# Patient Record
Sex: Female | Born: 1970 | Race: Black or African American | Hispanic: No | Marital: Single | Smoking: Former smoker
Health system: Southern US, Community
[De-identification: ages and names within clinical notes are randomized; demographics above are authoritative.]

## PROBLEM LIST (undated history)

## (undated) DIAGNOSIS — T7840XA Allergy, unspecified, initial encounter: Secondary | ICD-10-CM

## (undated) DIAGNOSIS — G43109 Migraine with aura, not intractable, without status migrainosus: Secondary | ICD-10-CM

## (undated) DIAGNOSIS — E119 Type 2 diabetes mellitus without complications: Secondary | ICD-10-CM

## (undated) HISTORY — PX: TUBAL LIGATION: SHX77

## (undated) HISTORY — PX: KNEE SURGERY: SHX244

## (undated) HISTORY — DX: Allergy, unspecified, initial encounter: T78.40XA

## (undated) HISTORY — PX: COLONOSCOPY: SHX174

## (undated) HISTORY — PX: BREAST SURGERY: SHX581

---

## 1999-05-23 ENCOUNTER — Other Ambulatory Visit: Admission: RE | Admit: 1999-05-23 | Discharge: 1999-05-23 | Payer: Self-pay | Admitting: Obstetrics

## 1999-11-03 ENCOUNTER — Inpatient Hospital Stay (HOSPITAL_COMMUNITY): Admission: AD | Admit: 1999-11-03 | Discharge: 1999-11-03 | Payer: Self-pay | Admitting: Obstetrics

## 1999-11-11 ENCOUNTER — Inpatient Hospital Stay (HOSPITAL_COMMUNITY): Admission: AD | Admit: 1999-11-11 | Discharge: 1999-11-11 | Payer: Self-pay | Admitting: Obstetrics

## 1999-11-13 ENCOUNTER — Inpatient Hospital Stay (HOSPITAL_COMMUNITY): Admission: AD | Admit: 1999-11-13 | Discharge: 1999-11-13 | Payer: Self-pay | Admitting: Obstetrics

## 1999-11-15 ENCOUNTER — Inpatient Hospital Stay (HOSPITAL_COMMUNITY): Admission: AD | Admit: 1999-11-15 | Discharge: 1999-11-15 | Payer: Self-pay | Admitting: Obstetrics

## 1999-11-15 ENCOUNTER — Inpatient Hospital Stay (HOSPITAL_COMMUNITY): Admission: AD | Admit: 1999-11-15 | Discharge: 1999-11-17 | Payer: Self-pay | Admitting: Obstetrics

## 1999-12-27 ENCOUNTER — Ambulatory Visit (HOSPITAL_COMMUNITY): Admission: RE | Admit: 1999-12-27 | Discharge: 1999-12-27 | Payer: Self-pay | Admitting: Obstetrics

## 2000-06-30 ENCOUNTER — Other Ambulatory Visit: Admission: RE | Admit: 2000-06-30 | Discharge: 2000-06-30 | Payer: Self-pay | Admitting: Plastic Surgery

## 2002-03-10 ENCOUNTER — Emergency Department (HOSPITAL_COMMUNITY): Admission: EM | Admit: 2002-03-10 | Discharge: 2002-03-10 | Payer: Self-pay | Admitting: Emergency Medicine

## 2002-03-10 ENCOUNTER — Encounter: Payer: Self-pay | Admitting: Emergency Medicine

## 2005-11-27 ENCOUNTER — Emergency Department (HOSPITAL_COMMUNITY): Admission: EM | Admit: 2005-11-27 | Discharge: 2005-11-27 | Payer: Self-pay | Admitting: Emergency Medicine

## 2006-07-18 ENCOUNTER — Emergency Department (HOSPITAL_COMMUNITY): Admission: EM | Admit: 2006-07-18 | Discharge: 2006-07-18 | Payer: Self-pay | Admitting: Family Medicine

## 2007-03-17 ENCOUNTER — Ambulatory Visit: Payer: Self-pay | Admitting: Gastroenterology

## 2007-03-17 LAB — CONVERTED CEMR LAB
ALT: 22 units/L (ref 0–40)
AST: 20 units/L (ref 0–37)
Albumin: 3.7 g/dL (ref 3.5–5.2)
Alkaline Phosphatase: 45 units/L (ref 39–117)
BUN: 11 mg/dL (ref 6–23)
Basophils Absolute: 0 10*3/uL (ref 0.0–0.1)
Basophils Relative: 0.2 % (ref 0.0–1.0)
Bilirubin, Direct: 0.1 mg/dL (ref 0.0–0.3)
CO2: 29 meq/L (ref 19–32)
Calcium: 9 mg/dL (ref 8.4–10.5)
Chloride: 108 meq/L (ref 96–112)
Creatinine, Ser: 0.7 mg/dL (ref 0.4–1.2)
Eosinophils Absolute: 0.5 10*3/uL (ref 0.0–0.6)
Eosinophils Relative: 5.5 % — ABNORMAL HIGH (ref 0.0–5.0)
GFR calc Af Amer: 123 mL/min
GFR calc non Af Amer: 102 mL/min
Glucose, Bld: 101 mg/dL — ABNORMAL HIGH (ref 70–99)
HCT: 38.7 % (ref 36.0–46.0)
Hemoglobin: 13.5 g/dL (ref 12.0–15.0)
Lymphocytes Relative: 41.5 % (ref 12.0–46.0)
MCHC: 34.9 g/dL (ref 30.0–36.0)
MCV: 85.9 fL (ref 78.0–100.0)
Monocytes Absolute: 0.5 10*3/uL (ref 0.2–0.7)
Monocytes Relative: 5.2 % (ref 3.0–11.0)
Neutro Abs: 4.4 10*3/uL (ref 1.4–7.7)
Neutrophils Relative %: 47.6 % (ref 43.0–77.0)
Platelets: 302 10*3/uL (ref 150–400)
Potassium: 4.1 meq/L (ref 3.5–5.1)
RBC: 4.51 M/uL (ref 3.87–5.11)
RDW: 12 % (ref 11.5–14.6)
Sodium: 142 meq/L (ref 135–145)
TSH: 0.52 microintl units/mL (ref 0.35–5.50)
Total Bilirubin: 0.9 mg/dL (ref 0.3–1.2)
Total Protein: 7 g/dL (ref 6.0–8.3)
WBC: 9.2 10*3/uL (ref 4.5–10.5)

## 2007-04-06 ENCOUNTER — Ambulatory Visit: Payer: Self-pay | Admitting: Gastroenterology

## 2007-05-12 ENCOUNTER — Ambulatory Visit: Payer: Self-pay | Admitting: Gastroenterology

## 2007-05-19 ENCOUNTER — Encounter: Payer: Self-pay | Admitting: Gastroenterology

## 2007-05-19 ENCOUNTER — Ambulatory Visit: Payer: Self-pay | Admitting: Gastroenterology

## 2008-01-12 ENCOUNTER — Ambulatory Visit (HOSPITAL_COMMUNITY): Admission: RE | Admit: 2008-01-12 | Discharge: 2008-01-12 | Payer: Self-pay | Admitting: Obstetrics

## 2008-01-22 ENCOUNTER — Encounter: Admission: RE | Admit: 2008-01-22 | Discharge: 2008-01-22 | Payer: Self-pay | Admitting: Obstetrics

## 2008-04-06 DIAGNOSIS — J309 Allergic rhinitis, unspecified: Secondary | ICD-10-CM | POA: Insufficient documentation

## 2008-05-31 ENCOUNTER — Ambulatory Visit: Payer: Self-pay | Admitting: Vascular Surgery

## 2008-05-31 ENCOUNTER — Encounter (INDEPENDENT_AMBULATORY_CARE_PROVIDER_SITE_OTHER): Payer: Self-pay | Admitting: Emergency Medicine

## 2008-05-31 ENCOUNTER — Emergency Department (HOSPITAL_COMMUNITY): Admission: EM | Admit: 2008-05-31 | Discharge: 2008-05-31 | Payer: Self-pay | Admitting: Emergency Medicine

## 2010-07-24 ENCOUNTER — Emergency Department (HOSPITAL_COMMUNITY): Admission: EM | Admit: 2010-07-24 | Discharge: 2010-07-24 | Payer: Self-pay | Admitting: Emergency Medicine

## 2011-03-05 NOTE — Assessment & Plan Note (Signed)
[facility]                         GASTROENTEROLOGY OFFICE NOTE   NAME:Adriana Preston, Adriana Preston                     MRN:          [id number]  DATE:03/17/2007                            DOB:          11-15-1970    GASTROENTEROLOGY CONSULTATION   REFERRING PHYSICIAN:  Patient was referred by Dr. Francoise Ceo.   PRIMARY CARE PHYSICIAN:  The patient has no primary care physician.   REASON FOR CONSULTATION:  Dr. Gaynell Face asked me to evaluate [name]  in consultation regarding intermittent rectal bleeding.   HISTORY OF PRESENT ILLNESS:  [name] is a very pleasant 40 year old  woman who for many years has had frequent stools on a daily basis.  She  goes generally four to five times a day.  They are formed and rarely  ever loose.  She will awaken at night just to move her bowels on a rare  occasion.  In the past two months she has seen blood in her stool on two  occasions.  She has mild IBS-like cramping prior to many bowel movements  that resolves after she moves her bowels.  She has no other abdominal  pains.  She had recent laboratory tests done by Dr. Gaynell Face showing a  hemoglobin of 11.3.   REVIEW OF SYSTEMS:  Her review of systems is notable for a 30 pound  weight gain in the past year and 100 pound weight gain in the past three  years.  The rest of her review of systems is essentially normal and is  available on her nursing intake sheet.   PAST MEDICAL HISTORY:  Status post tubal ligation in 2001.  Status post  breast surgery 2001.   CURRENT MEDICATIONS:  None.   ALLERGIES:  PENICILLIN.   SOCIAL HISTORY:  She is single with three children. 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  She smokes two to three cigarettes a  day.  Drinks alcohol intermittently.  Has two caffeinated beverages  daily.   FAMILY HISTORY:  No colon cancer, colon polyps in the family.   PHYSICAL EXAMINATION:  VITAL SIGNS:  Patient is 5 feet, 7  inches, 234  pounds (calculated BMI of 37).  Blood pressure 118/86. Pulse 84.  CONSTITUTIONAL:  Obese but otherwise well-appearing.  NEUROLOGICAL:  Alert and oriented x3.  HEENT:  Eyes: Extraocular movements intact. Mouth with oropharynx moist  with no lesions.  NECK:  Supple. No  lymphadenopathy.  CARDIOVASCULAR:  Heart has regular rate and rhythm.  LUNGS:  Clear to auscultation bilaterally.  ABDOMEN:  Soft, nontender, nondistended.  Normal bowel sounds.  EXTREMITIES:  No lower extremity edema.  SKIN:  No rashes or lesions on visible extremities.   ASSESSMENT/PLAN:  The patient is a 40 year old woman with chronic bowel  frequency, rare nocturnal stools, two episodes of minor rectal bleeding.   She may simply have hemorrhoids or possibly some minor  colitis/proctitis.  She does have more than the usual number of stools a  day and some intermittent nocturnal stooling.  I think it is unlikely  that she has colon cancer.  We should definitely proceed with  full  colonoscopy at her soonest convenience.  I will also get a set of lab  tests including a complete metabolic profile, thyroid testing and CBC.  She has been gaining weight lately at a pretty fast clip and unless this  is thyroid-related, she is going to have to seriously change her diet.     Rachael Fee, MD  Electronically Signed    DPJ/MedQ  DD: 03/17/2007  DT: 03/17/2007  Job #: [id number]   cc:   Kathreen Cosier, M.D.

## 2011-10-13 ENCOUNTER — Observation Stay (HOSPITAL_COMMUNITY)
Admission: EM | Admit: 2011-10-13 | Discharge: 2011-10-17 | Disposition: A | Discharge status: Home / Self Care | Payer: Medicaid Other | Attending: Internal Medicine | Admitting: Internal Medicine

## 2011-10-13 DIAGNOSIS — L03119 Cellulitis of unspecified part of limb: Secondary | ICD-10-CM

## 2011-10-13 DIAGNOSIS — L02419 Cutaneous abscess of limb, unspecified: Principal | ICD-10-CM | POA: Insufficient documentation

## 2011-10-13 DIAGNOSIS — R739 Hyperglycemia, unspecified: Secondary | ICD-10-CM

## 2011-10-13 DIAGNOSIS — E119 Type 2 diabetes mellitus without complications: Secondary | ICD-10-CM | POA: Insufficient documentation

## 2011-10-13 DIAGNOSIS — R519 Headache, unspecified: Secondary | ICD-10-CM

## 2011-10-13 DIAGNOSIS — J309 Allergic rhinitis, unspecified: Secondary | ICD-10-CM

## 2011-10-13 DIAGNOSIS — R51 Headache: Secondary | ICD-10-CM | POA: Insufficient documentation

## 2011-10-13 HISTORY — DX: Migraine with aura, not intractable, without status migrainosus: G43.109

## 2011-10-14 ENCOUNTER — Inpatient Hospital Stay (HOSPITAL_COMMUNITY): Payer: Medicaid Other

## 2011-10-14 ENCOUNTER — Other Ambulatory Visit: Payer: Self-pay

## 2011-10-14 DIAGNOSIS — R519 Headache, unspecified: Secondary | ICD-10-CM

## 2011-10-14 DIAGNOSIS — M79609 Pain in unspecified limb: Secondary | ICD-10-CM

## 2011-10-14 DIAGNOSIS — R739 Hyperglycemia, unspecified: Secondary | ICD-10-CM

## 2011-10-14 DIAGNOSIS — L539 Erythematous condition, unspecified: Secondary | ICD-10-CM

## 2011-10-14 DIAGNOSIS — R51 Headache: Secondary | ICD-10-CM

## 2011-10-14 DIAGNOSIS — L03119 Cellulitis of unspecified part of limb: Secondary | ICD-10-CM

## 2011-10-14 LAB — DIFFERENTIAL
Basophils Absolute: 0 10*3/uL (ref 0.0–0.1)
Lymphocytes Relative: 29 % (ref 12–46)
Monocytes Absolute: 0.8 10*3/uL (ref 0.1–1.0)
Neutro Abs: 8.1 10*3/uL — ABNORMAL HIGH (ref 1.7–7.7)

## 2011-10-14 LAB — COMPREHENSIVE METABOLIC PANEL
ALT: 30 U/L (ref 0–35)
AST: 18 U/L (ref 0–37)
Albumin: 3.3 g/dL — ABNORMAL LOW (ref 3.5–5.2)
Alkaline Phosphatase: 80 U/L (ref 39–117)
Glucose, Bld: 335 mg/dL — ABNORMAL HIGH (ref 70–99)
Potassium: 3.5 mEq/L (ref 3.5–5.1)
Sodium: 134 mEq/L — ABNORMAL LOW (ref 135–145)
Total Protein: 7.5 g/dL (ref 6.0–8.3)

## 2011-10-14 LAB — D-DIMER, QUANTITATIVE: D-Dimer, Quant: 0.4 ug/mL-FEU (ref 0.00–0.48)

## 2011-10-14 LAB — BASIC METABOLIC PANEL
CO2: 26 mEq/L (ref 19–32)
Chloride: 95 mEq/L — ABNORMAL LOW (ref 96–112)
Creatinine, Ser: 0.77 mg/dL (ref 0.50–1.10)

## 2011-10-14 LAB — GLUCOSE, CAPILLARY
Glucose-Capillary: 340 mg/dL — ABNORMAL HIGH (ref 70–99)
Glucose-Capillary: 312 mg/dL — ABNORMAL HIGH (ref 70–99)

## 2011-10-14 LAB — CBC
HCT: 40.2 % (ref 36.0–46.0)
HCT: 39 % (ref 36.0–46.0)
Hemoglobin: 13.6 g/dL (ref 12.0–15.0)
MCH: 28.7 pg (ref 26.0–34.0)
MCHC: 33.3 g/dL (ref 30.0–36.0)
MCV: 86.1 fL (ref 78.0–100.0)
RDW: 12.3 % (ref 11.5–15.5)
RDW: 12.4 % (ref 11.5–15.5)
WBC: 12.8 10*3/uL — ABNORMAL HIGH (ref 4.0–10.5)

## 2011-10-14 LAB — SEDIMENTATION RATE: Sed Rate: 20 mm/hr (ref 0–22)

## 2011-10-14 MED ORDER — DIPHENHYDRAMINE HCL 50 MG/ML IJ SOLN
25.0000 mg | Freq: Once | INTRAMUSCULAR | Status: AC
  Administered 2011-10-14: 25 mg via INTRAVENOUS
  Filled 2011-10-14: qty 1

## 2011-10-14 MED ORDER — METOCLOPRAMIDE HCL 5 MG/ML IJ SOLN
10.0000 mg | Freq: Once | INTRAMUSCULAR | Status: AC
  Administered 2011-10-14: 10 mg via INTRAVENOUS
  Filled 2011-10-14: qty 2

## 2011-10-14 MED ORDER — PIPERACILLIN-TAZOBACTAM 3.375 G IVPB
3.3750 g | Freq: Three times a day (TID) | INTRAVENOUS | Status: DC
  Administered 2011-10-14 – 2011-10-17 (×9): 3.375 g via INTRAVENOUS
  Filled 2011-10-14 (×11): qty 50

## 2011-10-14 MED ORDER — ACETAMINOPHEN 325 MG PO TABS
650.0000 mg | ORAL_TABLET | Freq: Four times a day (QID) | ORAL | Status: DC | PRN
  Administered 2011-10-14 – 2011-10-15 (×4): 650 mg via ORAL
  Filled 2011-10-14 (×4): qty 2

## 2011-10-14 MED ORDER — ONDANSETRON HCL 4 MG/2ML IJ SOLN: 4.0000 mg | Freq: Three times a day (TID) | INTRAMUSCULAR | Status: DC | PRN

## 2011-10-14 MED ORDER — OXYCODONE HCL 5 MG PO TABS
5.0000 mg | ORAL_TABLET | ORAL | Status: DC | PRN
  Administered 2011-10-14 – 2011-10-17 (×13): 5 mg via ORAL
  Filled 2011-10-14 (×13): qty 1

## 2011-10-14 MED ORDER — ONDANSETRON HCL 4 MG PO TABS: 4.0000 mg | ORAL_TABLET | Freq: Four times a day (QID) | ORAL | Status: DC | PRN

## 2011-10-14 MED ORDER — INSULIN ASPART 100 UNIT/ML ~~LOC~~ SOLN
10.0000 [IU] | Freq: Once | SUBCUTANEOUS | Status: AC
  Administered 2011-10-14: 10 [IU] via INTRAVENOUS
  Filled 2011-10-14: qty 1

## 2011-10-14 MED ORDER — MORPHINE SULFATE 4 MG/ML IJ SOLN
4.0000 mg | Freq: Once | INTRAMUSCULAR | Status: AC
  Administered 2011-10-14: 4 mg via INTRAVENOUS
  Filled 2011-10-14: qty 1

## 2011-10-14 MED ORDER — CIPROFLOXACIN IN D5W 400 MG/200ML IV SOLN
400.0000 mg | Freq: Two times a day (BID) | INTRAVENOUS | Status: DC
  Administered 2011-10-14 (×2): 400 mg via INTRAVENOUS
  Filled 2011-10-14 (×3): qty 200

## 2011-10-14 MED ORDER — INSULIN ASPART 100 UNIT/ML ~~LOC~~ SOLN
0.0000 [IU] | Freq: Three times a day (TID) | SUBCUTANEOUS | Status: DC
  Administered 2011-10-14: 7 [IU] via SUBCUTANEOUS
  Administered 2011-10-14: 5 [IU] via SUBCUTANEOUS
  Administered 2011-10-14: 7 [IU] via SUBCUTANEOUS
  Administered 2011-10-15: 5 [IU] via SUBCUTANEOUS
  Filled 2011-10-14: qty 3

## 2011-10-14 MED ORDER — INSULIN GLARGINE 100 UNIT/ML ~~LOC~~ SOLN
10.0000 [IU] | Freq: Every day | SUBCUTANEOUS | Status: DC
  Administered 2011-10-14: 10 [IU] via SUBCUTANEOUS
  Filled 2011-10-14: qty 3

## 2011-10-14 MED ORDER — SODIUM CHLORIDE 0.9 % IV SOLN: INTRAVENOUS | Status: DC

## 2011-10-14 MED ORDER — ACETAMINOPHEN 650 MG RE SUPP: 650.0000 mg | Freq: Four times a day (QID) | RECTAL | Status: DC | PRN

## 2011-10-14 MED ORDER — ONDANSETRON HCL 4 MG/2ML IJ SOLN: 4.0000 mg | Freq: Four times a day (QID) | INTRAMUSCULAR | Status: DC | PRN

## 2011-10-14 MED ORDER — CEFAZOLIN SODIUM 1-5 GM-% IV SOLN
1.0000 g | Freq: Once | INTRAVENOUS | Status: AC
  Administered 2011-10-14: 1 g via INTRAVENOUS
  Filled 2011-10-14: qty 50

## 2011-10-14 MED ORDER — HYDROMORPHONE HCL PF 1 MG/ML IJ SOLN: 1.0000 mg | INTRAMUSCULAR | Status: DC | PRN

## 2011-10-14 MED ORDER — ENOXAPARIN SODIUM 40 MG/0.4ML ~~LOC~~ SOLN
40.0000 mg | SUBCUTANEOUS | Status: DC
  Administered 2011-10-14 – 2011-10-16 (×3): 40 mg via SUBCUTANEOUS
  Filled 2011-10-14 (×4): qty 0.4

## 2011-10-14 MED ORDER — VANCOMYCIN HCL 1000 MG IV SOLR
1500.0000 mg | INTRAVENOUS | Status: DC
  Administered 2011-10-14 – 2011-10-17 (×4): 1500 mg via INTRAVENOUS
  Filled 2011-10-14 (×5): qty 1500

## 2011-10-14 MED ORDER — SODIUM CHLORIDE 0.9 % IV SOLN
INTRAVENOUS | Status: DC
  Administered 2011-10-14 – 2011-10-15 (×2): via INTRAVENOUS

## 2011-10-14 MED ORDER — SODIUM CHLORIDE 0.9 % IV SOLN
INTRAVENOUS | Status: DC
  Administered 2011-10-14: 125 mL via INTRAVENOUS

## 2011-10-14 NOTE — Progress Notes (Signed)
Subjective: No Ha; no CP, no SOB. RLE with tenderness to palpation; also with erythema and warm feeling. LE duplex negative.  Objective: Vital signs in last 24 hours: Temp:  [97 F (36.1 C)-99.1 F (37.3 C)] 97 F (36.1 C) (12/24 1328) Pulse Rate:  [85-106] 92  (12/24 1328) Resp:  [14-20] 18  (12/24 1328) BP: (115-138)/(61-84) 128/84 mmHg (12/24 1328) SpO2:  [82 %-100 %] 97 % (12/24 1328) Weight:  [53.343 kg (117 lb 9.6 oz)] 117 lb 9.6 oz (53.343 kg) (12/24 0415) Weight change:  Last BM Date: 10/13/11  Intake/Output from previous day: 12/23 0701 - 12/24 0700 In: 240 [P.O.:240] Out: -  Total I/O In: 740 [P.O.:240; IV Piggyback:500] Out: -    Physical Exam: General: Alert, awake, oriented x3, in no acute distress. HEENT: No bruits, no goiter. Heart: Regular rate and rhythm, without murmurs, rubs, gallops. Lungs: Clear to auscultation bilaterally. Abdomen: Soft, nontender, nondistended, positive bowel sounds. Extremities: warm to touch, tender and with erythema on her RLE; no edema. Neuro: Grossly intact, nonfocal.  Lab Results: Basic Metabolic Panel:  Basename 10/14/11 0852 10/14/11 0107  NA 134* 130*  K 3.5 4.0  CL 100 95*  CO2 26 26  GLUCOSE 335* 438*  BUN 10 13  CREATININE 0.58 0.77  CALCIUM 9.0 9.6  MG -- --  PHOS -- --   Liver Function Tests:  [facility] 10/14/11 0852  AST 18  ALT 30  ALKPHOS 80  BILITOT 0.5  PROT 7.5  ALBUMIN 3.3*   CBC:  Basename 10/14/11 0852 10/14/11 0107  WBC 12.0* 12.8*  NEUTROABS -- 8.1*  HGB 13.0 13.6  HCT 39.0 40.2  MCV 86.1 85.9  PLT 256 274   D-Dimer:  Basename 10/14/11 0107  DDIMER 0.40   CBG:  Basename 10/14/11 1721 10/14/11 1106 10/14/11 0412  GLUCAP 312* 280* 340*   Hemoglobin A1C:  Basename 10/14/11 0852  HGBA1C 12.4*   Thyroid Function Tests:  Basename 10/14/11 0852  TSH 2.118  T4TOTAL --  FREET4 --  T3FREE --  THYROIDAB --    Studies/Results: Ct Head Wo Contrast  10/14/2011   *RADIOLOGY REPORT*  Clinical Data: 40 year old female with headache.  Cellulitis.  CT HEAD WITHOUT CONTRAST  Technique:  Contiguous axial images were obtained from the base of the skull through the vertex without contrast.  Comparison: None.  Findings: Visualized paranasal sinuses and mastoids are clear.  No acute osseous abnormality identified.  Visualized orbits and scalp soft tissues are within normal limits.  Cerebral volume is within normal limits for age.  No midline shift, ventriculomegaly, mass effect, evidence of mass lesion, intracranial hemorrhage or evidence of cortically based acute infarction.  Gray-white matter differentiation is within normal limits throughout the brain.  No suspicious intracranial vascular hyperdensity.   IMPRESSION: Normal noncontrast CT appearance of the brain.  Original Report Authenticated By: Harley Hallmark, M.D.    Medications: Scheduled Meds:    . ceFAZolin (ANCEF) IV  1 g Intravenous Once  . ciprofloxacin  400 mg Intravenous Q12H  . diphenhydrAMINE  25 mg Intravenous Once  . enoxaparin (LOVENOX) injection  40 mg Subcutaneous Q24H  . insulin aspart  0-9 Units Subcutaneous TID WC  . insulin aspart  10 Units Intravenous Once  . insulin glargine  10 Units Subcutaneous QHS  . metoCLOPramide (REGLAN) injection  10 mg Intravenous Once  .  morphine injection  4 mg Intravenous Once  . vancomycin  1,500 mg Intravenous Q24H  . DISCONTD: sodium chloride  Intravenous STAT   Continuous Infusions:   . sodium chloride 125 mL/hr at 10/14/11 0528  . DISCONTD: sodium chloride 125 mL (10/14/11 0206)   PRN Meds:.acetaminophen, acetaminophen, ondansetron (ZOFRAN) IV, ondansetron, oxyCODONE, DISCONTD:  HYDROmorphone (DILAUDID) injection, DISCONTD: ondansetron (ZOFRAN) IV  Assessment/Plan: 1-Right lower extremity Cellulitis: continue IV antibiotics (vanc and zosyn); IVF's and pain medications as needed. Depending evolution on patient symptoms transition to PO regimen vs  need of image studies.  2-Headache: resolved after CBG's better controlled and pain meds. CT negative  3-Hyperglycemia:secondary to DM mellitus; A1C 12.4. Continue SSI and Lantus while in the hospital for tight control. Diabetes team education and will check lipid profile and TSH for stratification.  4-Leukocytosis: 2/2 #1; continue antibiotics and follow WBC's trend.  5-DVT: lovenox   LOS: 1 day   Omarr Hann [facility] [phone]  10/14/2011, 6:26 PM

## 2011-10-14 NOTE — H&P (Signed)
Adriana Preston is an 40 y.o. female.   PCP - None.         Chief Complaint: Headache and leg pain. HPI: 40 year old female with no significant past medical history has come to the ER because of headache over the last 2 days or the patient also has been experiencing some pain in the lower extremities. In the ER patient was found to have some erythema in the right anterior shin some leukocytosis. Patient has some subjective feeling of fever and chills. Pain but some pain relief medication headache is resolved. Patient states she history of migraines and it gets an attack every 3 months which gets relieved by NSAID's. Patient did not have any dizziness or loss of consciousness or focal deficits. Patient patient was found to have hyperglycemia with blood sugars in the 400s but not in DKA. Patient does not have a history of diabetes.  History reviewed. No pertinent past medical history.  Past Surgical History  Procedure Date  . Breast surgery   . Tubal ligation     Family History  Problem Relation Age of Onset  . Hypertension Mother   . Aneurysm Mother   . Coronary aneurysm Mother   . Hypertension Father   . Glaucoma Father    Social History:  reports that she quit smoking about 4 months ago. She quit smokeless tobacco use about 4 months ago. She reports that she drinks alcohol. She reports that she does not use illicit drugs.  Allergies:  Allergies  Allergen Reactions  . Banana Swelling  . Penicillins Swelling  . Tomato Swelling    Medications Prior to Admission  Medication Dose Route Frequency Provider Last Rate Last Dose  . 0.9 %  sodium chloride infusion   Intravenous Continuous Eduard Clos      . acetaminophen (TYLENOL) tablet 650 mg  650 mg Oral Q6H PRN Eduard Clos       Or  . acetaminophen (TYLENOL) suppository 650 mg  650 mg Rectal Q6H PRN Eduard Clos      . ceFAZolin (ANCEF) IVPB 1 g/50 mL premix  1 g Intravenous Once Dione Booze, MD   1 g at  10/14/11 0158  . diphenhydrAMINE (BENADRYL) injection 25 mg  25 mg Intravenous Once Dione Booze, MD   25 mg at 10/14/11 0158  . insulin aspart (novoLOG) injection 10 Units  10 Units Intravenous Once Dione Booze, MD   10 Units at 10/14/11 0307  . metoCLOPramide (REGLAN) injection 10 mg  10 mg Intravenous Once Dione Booze, MD   10 mg at 10/14/11 0158  . morphine 4 MG/ML injection 4 mg  4 mg Intravenous Once Dione Booze, MD   4 mg at 10/14/11 0158  . ondansetron (ZOFRAN) tablet 4 mg  4 mg Oral Q6H PRN Eduard Clos       Or  . ondansetron (ZOFRAN) injection 4 mg  4 mg Intravenous Q6H PRN Eduard Clos      . oxyCODONE (Oxy IR/ROXICODONE) immediate release tablet 5 mg  5 mg Oral Q4H PRN Eduard Clos      . DISCONTD: 0.9 %  sodium chloride infusion   Intravenous Continuous Dione Booze, MD 125 mL/hr at 10/14/11 0206 125 mL at 10/14/11 0206  . DISCONTD: 0.9 %  sodium chloride infusion   Intravenous STAT Dione Booze, MD      . DISCONTD: HYDROmorphone (DILAUDID) injection 1 mg  1 mg Intravenous Q4H PRN Dione Booze, MD      .  DISCONTD: ondansetron (ZOFRAN) injection 4 mg  4 mg Intravenous Q8H PRN Dione Booze, MD       No current outpatient prescriptions on file as of 10/14/2011.    Results for orders placed during the hospital encounter of 10/13/11 (from the past 48 hour(s))  CBC     Status: Abnormal   Collection Time   10/14/11  1:07 AM      Component Value Range Comment   WBC 12.8 (*) 4.0 - 10.5 (K/uL)    RBC 4.68  3.87 - 5.11 (MIL/uL)    Hemoglobin 13.6  12.0 - 15.0 (g/dL)    HCT 45.4  09.8 - 11.9 (%)    MCV 85.9  78.0 - 100.0 (fL)    MCH 29.1  26.0 - 34.0 (pg)    MCHC 33.8  30.0 - 36.0 (g/dL)    RDW 14.7  82.9 - 56.2 (%)    Platelets 274  150 - 400 (K/uL)   DIFFERENTIAL     Status: Abnormal   Collection Time   10/14/11  1:07 AM      Component Value Range Comment   Neutrophils Relative 63  43 - 77 (%)    Neutro Abs 8.1 (*) 1.7 - 7.7 (K/uL)    Lymphocytes Relative  29  12 - 46 (%)    Lymphs Abs 3.7  0.7 - 4.0 (K/uL)    Monocytes Relative 6  3 - 12 (%)    Monocytes Absolute 0.8  0.1 - 1.0 (K/uL)    Eosinophils Relative 1  0 - 5 (%)    Eosinophils Absolute 0.1  0.0 - 0.7 (K/uL)    Basophils Relative 0  0 - 1 (%)    Basophils Absolute 0.0  0.0 - 0.1 (K/uL)   BASIC METABOLIC PANEL     Status: Abnormal   Collection Time   10/14/11  1:07 AM      Component Value Range Comment   Sodium 130 (*) 135 - 145 (mEq/L)    Potassium 4.0  3.5 - 5.1 (mEq/L)    Chloride 95 (*) 96 - 112 (mEq/L)    CO2 26  19 - 32 (mEq/L)    Glucose, Bld 438 (*) 70 - 99 (mg/dL)    BUN 13  6 - 23 (mg/dL)    Creatinine, Ser 1.30  0.50 - 1.10 (mg/dL)    Calcium 9.6  8.4 - 10.5 (mg/dL)    GFR calc non Af Amer >90  >90 (mL/min)    GFR calc Af Amer >90  >90 (mL/min)   SEDIMENTATION RATE     Status: Normal   Collection Time   10/14/11  1:07 AM      Component Value Range Comment   Sed Rate 20  0 - 22 (mm/hr)   D-DIMER, QUANTITATIVE     Status: Normal   Collection Time   10/14/11  1:07 AM      Component Value Range Comment   D-Dimer, Quant 0.40  0.00 - 0.48 (ug/mL-FEU)   GLUCOSE, CAPILLARY     Status: Abnormal   Collection Time   10/14/11  4:12 AM      Component Value Range Comment   Glucose-Capillary 340 (*) 70 - 99 (mg/dL)    No results found.  Review of Systems  Constitutional: Positive for chills.  Eyes: Negative.   Respiratory: Negative.   Cardiovascular: Negative.   Gastrointestinal: Negative.   Genitourinary: Negative.   Musculoskeletal: Negative.        Leg pain.  Skin: Negative.   Neurological: Positive for headaches.  Endo/Heme/Allergies: Negative.   Psychiatric/Behavioral: Negative.     Blood pressure 132/83, pulse 96, temperature 98.9 F (37.2 C), temperature source Oral, resp. rate 20, height 5\' 7"  (1.702 m), weight 53.343 kg (117 lb 9.6 oz), SpO2 99.00%. Physical Exam  Constitutional: She is oriented to person, place, and time. She appears  well-developed and well-nourished. No distress.  HENT:  Head: Normocephalic and atraumatic.  Right Ear: External ear normal.  Left Ear: External ear normal.  Nose: Nose normal.  Mouth/Throat: Oropharynx is clear and moist. No oropharyngeal exudate.  Eyes: Conjunctivae and EOM are normal. Pupils are equal, round, and reactive to light. Right eye exhibits no discharge. Left eye exhibits no discharge. No scleral icterus.  Neck: Normal range of motion. Neck supple.  Cardiovascular: Normal rate, regular rhythm and normal heart sounds.   Respiratory: Effort normal and breath sounds normal. No respiratory distress. She has no wheezes. She has no rales.  GI: Soft. Bowel sounds are normal. She exhibits no distension. There is no tenderness. There is no rebound.  Musculoskeletal:       Mild erythema of the right lower extremity involving an area of 10 by 5 cm.  Neurological: She is alert and oriented to person, place, and time. She has normal reflexes.  Skin: Skin is warm and dry. She is not diaphoretic.  Psychiatric: Her behavior is normal.     Assessment/Plan #1. Cellulitis of the right lower extremity - for now we will treat with IV vancomycin and ciprofloxacin and slowly transitioned to oral antibiotics based on her response to antibiotics. As patient is also complaining of calf pain on the right lower extremity we'll get a Doppler to rule out DVT. #2. Hyperglycemia, probably new onset diabetes mellitus type 2 - will check hemoglobin A1c, will place patient on sliding scale insulin coverage for now and probably patient can be started on oral antidiabetic medications at discharge. #3. Headache - as patient's mother has had cerebral aneurysms we will check CT head without contrast now. Patient will need further workup for headache through her primary care physician.  CODE STATUS - full code.  Chaka Jefferys N. 10/14/2011, 4:26 AM

## 2011-10-14 NOTE — ED Provider Notes (Signed)
History     CSN: [id number]  Arrival date & time 10/13/11  2321   First MD Initiated Contact with Patient 10/14/11 0034      Chief Complaint  Patient presents with  . Numbness  . Migraine  . Leg Swelling    right    (Consider location/radiation/quality/duration/timing/severity/associated sxs/prior treatment) Patient is a 40 y.o. female presenting with migraine. The history is provided by the patient.  Migraine   she's had a headache for the last 3 days which is pretty much global and described as a pounding sensation. The pain is 6/10 currently but was 10 out of 10 at its worst. Nothing makes the headache worse, it but it does improve with ibuprofen. He denies any photophobia, visual disturbance, nausea, vomiting, fever. She states that she gets these migraine headaches every few months. This headache is fairly typical of her headaches. Last night, she noted a severe burning pain and swelling in her right lower leg. She has a history of having had cellulitis in that leg about 3 years ago. Pain in the leg his rated at best 7/10. It is worse with palpation and worse with standing. Nothing makes it any better. She has had chills since the onset of pain but has not had a fever has not broken out in sweats.  No past medical history on file.  Past Surgical History  Procedure Date  . Breast surgery   . Tubal ligation     Family History  Problem Relation Age of Onset  . Hypertension Mother   . Aneurysm Mother   . Coronary aneurysm Mother   . Hypertension Father   . Glaucoma Father     History  Substance Use Topics  . Smoking status: Former Smoker    Quit date: 05/23/2011  . Smokeless tobacco: Former Neurosurgeon    Quit date: 05/23/2011  . Alcohol Use: Yes    OB History    Grav Para Term Preterm Abortions TAB SAB Ect Mult Living                  Review of Systems  All other systems reviewed and are negative.    Allergies  Banana; Penicillins; and Tomato  Home  Medications  No current outpatient prescriptions on file.  BP 138/84  Pulse 94  Temp(Src) 99.1 F (37.3 C) (Oral)  Resp 20  Ht 5\' 7"  (1.702 m)  SpO2 95%  Physical Exam  Nursing note and vitals reviewed.  40 year old female is resting comfortably and in no acute distress. Vital signs are significant for tachycardia with heart rate 104. Oxygen saturation is 97% which is normal. Head is normocephalic and atraumatic. PERRLA, EOMI. There is no sinus tenderness. Fundi are normal. Neck is supple without adenopathy or JVD. Lungs are clear without rales, wheezes, rhonchi. Back is nontender. Heart has regular rate and rhythm without murmur. Abdomen is soft, flat, nontender without masses or hepatosplenomegaly. Extremities: the right lower leg has erythema and warmth. The skin is thickened and cracked which the patient states as been like that since her episode of cellulitis. The right calf circumference is approximately 2 cm greater than the left calf circumference. No cords are palpable. There is negative Homans sign. Remainder of extremity exam is normal. Skin has no other rashes except as noted above. Neurologic: Mental status is normal, cranial nerves are intact, there no focal motor or sensory deficits. Psychiatric: No abnormalities of mood or affect.  ED Course  Procedures (including critical care time)  Labs Reviewed  CBC  DIFFERENTIAL  BASIC METABOLIC PANEL  SEDIMENTATION RATE  D-DIMER, QUANTITATIVE   No results found. Results for orders placed during the hospital encounter of 10/13/11  CBC      Component Value Range   WBC 12.8 (*) 4.0 - 10.5 (K/uL)   RBC 4.68  3.87 - 5.11 (MIL/uL)   Hemoglobin 13.6  12.0 - 15.0 (g/dL)   HCT 98.1  19.1 - 47.8 (%)   MCV 85.9  78.0 - 100.0 (fL)   MCH 29.1  26.0 - 34.0 (pg)   MCHC 33.8  30.0 - 36.0 (g/dL)   RDW 29.5  62.1 - 30.8 (%)   Platelets 274  150 - 400 (K/uL)  DIFFERENTIAL      Component Value Range   Neutrophils Relative 63  43 - 77 (%)     Neutro Abs 8.1 (*) 1.7 - 7.7 (K/uL)   Lymphocytes Relative 29  12 - 46 (%)   Lymphs Abs 3.7  0.7 - 4.0 (K/uL)   Monocytes Relative 6  3 - 12 (%)   Monocytes Absolute 0.8  0.1 - 1.0 (K/uL)   Eosinophils Relative 1  0 - 5 (%)   Eosinophils Absolute 0.1  0.0 - 0.7 (K/uL)   Basophils Relative 0  0 - 1 (%)   Basophils Absolute 0.0  0.0 - 0.1 (K/uL)  BASIC METABOLIC PANEL      Component Value Range   Sodium 130 (*) 135 - 145 (mEq/L)   Potassium 4.0  3.5 - 5.1 (mEq/L)   Chloride 95 (*) 96 - 112 (mEq/L)   CO2 26  19 - 32 (mEq/L)   Glucose, Bld 438 (*) 70 - 99 (mg/dL)   BUN 13  6 - 23 (mg/dL)   Creatinine, Ser 6.57  0.50 - 1.10 (mg/dL)   Calcium 9.6  8.4 - 84.6 (mg/dL)   GFR calc non Af Amer >90  >90 (mL/min)   GFR calc Af Amer >90  >90 (mL/min)  SEDIMENTATION RATE      Component Value Range   Sed Rate 20  0 - 22 (mm/hr)  D-DIMER, QUANTITATIVE      Component Value Range   D-Dimer, Quant 0.40  0.00 - 0.48 (ug/mL-FEU)   No results found.    No diagnosis found.  ECG shows normal sinus rhythm with a rate of 95, no ectopy. Normal axis. Normal P wave. Normal QRS. Normal intervals. Normal ST and T waves. Impression: normal ECG. No previous ECG available for comparison.  She's given IV Reglan and Benadryl for headache with good relief. She is also given morphine for pain with good relief of her leg pain. Laboratory workup is significant for hyperglycemia in a patient who is not known to be diabetic previously. She was started on IV Ancef her cellulitis and was given a dose of insulin IV for her hyperglycemia. Because of the new-onset diabetes along with the cellulitis, this is likely to start her treatment as an inpatient. Case is discussed with Dr. Toniann Fail who agrees to admit the patient requests that I write temporary admitting orders. MDM  Probable cellulitis of the right leg, rule out DVT. Headache which is probably muscle contraction headache. Old charts are reviewed and when she had  her prior episode of cellulitis she was placed on Keflex so apparently she can tolerate cephalosporins in spite of the penicillin allergy.        Dione Booze, MD 10/14/11 [phone]

## 2011-10-14 NOTE — Progress Notes (Addendum)
ANTIBIOTIC CONSULT NOTE - INITIAL  Pharmacy Consult for Zosyn Indication: cellulitis  Allergies  Allergen Reactions  . Banana Swelling  . Penicillins Swelling  . Tomato Swelling    Patient Measurements: Height: 5\' 7"  (170.2 cm) Weight: 117 lb 9.6 oz (53.343 kg) IBW/kg (Calculated) : 61.6   Vital Signs: Temp: 97 F (36.1 C) (12/24 1328) Temp src: Oral (12/24 1328) BP: 128/84 mmHg (12/24 1328) Pulse Rate: 92  (12/24 1328)  Labs:  [facility] 10/14/11 0852 10/14/11 0107  WBC 12.0* 12.8*  HGB 13.0 13.6  PLT 256 274  LABCREA -- --  CREATININE 0.58 0.77   Estimated Creatinine Clearance: 79.4 ml/min (by C-G formula based on Cr of 0.58).  Microbiology: No results found for this or any previous visit (from the past 720 hour(s)).  Medical History: Past Medical History  Diagnosis Date  . Headache, classical migraine     Medications:  No prescriptions prior to admission   Scheduled:     . ceFAZolin (ANCEF) IV  1 g Intravenous Once  . ciprofloxacin  400 mg Intravenous Q12H  . diphenhydrAMINE  25 mg Intravenous Once  . enoxaparin (LOVENOX) injection  40 mg Subcutaneous Q24H  . insulin aspart  0-9 Units Subcutaneous TID WC  . insulin aspart  10 Units Intravenous Once  . insulin glargine  10 Units Subcutaneous QHS  . metoCLOPramide (REGLAN) injection  10 mg Intravenous Once  .  morphine injection  4 mg Intravenous Once  . piperacillin-tazobactam (ZOSYN)  IV  3.375 g Intravenous Q8H  . vancomycin  1,500 mg Intravenous Q24H  . DISCONTD: sodium chloride   Intravenous STAT   Assessment: 40yo female c/o erythema to shin, found with leukocytosis, to broaden IV ABX for cellulitis with zosyn (MD aware of allergy in system to Penicillins and wants to continue with zosyn) will monitor for reaction. To transition to PO when responding to IV.  Goal of Therapy:  Vancomycin trough level 10-15 mcg/ml  Plan:  1) Zosyn 3.375 g IV q8 hrs (4 hr infusion) 2)Continue vancomycin 1500mg   IV Q24H and Cipro 400mg  IV Q12H.  Monitor CBC, Cx, levels prn.  F/U change to PO.  Janace Litten PharmD 10/14/2011,7:06 PM

## 2011-10-14 NOTE — Progress Notes (Signed)
*  PRELIMINARY RESULTS*  Right Lower Extremity Venous  has been performed. Right:  No evidence of DVT, superficial thrombosis, or Baker's cyst.   Farrel Demark RDMS 10/14/2011, 10:33 AM

## 2011-10-14 NOTE — ED Notes (Signed)
Patient states she has had migraines x 2 days, numbness in right hand x 2 days, and swelling in the LLE x 1 day.  No hx of blood clots.  Hx of cellulitis 3 years ago.

## 2011-10-14 NOTE — Progress Notes (Signed)
ANTIBIOTIC CONSULT NOTE - INITIAL  Pharmacy Consult for Cipro/Vancocin Indication: cellulitis  Allergies  Allergen Reactions  . Banana Swelling  . Penicillins Swelling  . Tomato Swelling    Patient Measurements: Height: 5\' 7"  (170.2 cm) Weight: 117 lb 9.6 oz (53.343 kg) IBW/kg (Calculated) : 61.6   Vital Signs: Temp: 98.9 F (37.2 C) (12/24 0415) Temp src: Oral (12/24 0415) BP: 132/83 mmHg (12/24 0415) Pulse Rate: 96  (12/24 0415)  Labs:  Basename 10/14/11 0107  WBC 12.8*  HGB 13.6  PLT 274  LABCREA --  CREATININE 0.77   Estimated Creatinine Clearance: 79.4 ml/min (by C-G formula based on Cr of 0.77).  Microbiology: No results found for this or any previous visit (from the past 720 hour(s)).  Medical History: Past Medical History  Diagnosis Date  . Headache, classical migraine     Medications:  No prescriptions prior to admission   Scheduled:    . ceFAZolin (ANCEF) IV  1 g Intravenous Once  . diphenhydrAMINE  25 mg Intravenous Once  . insulin aspart  0-9 Units Subcutaneous TID WC  . insulin aspart  10 Units Intravenous Once  . metoCLOPramide (REGLAN) injection  10 mg Intravenous Once  .  morphine injection  4 mg Intravenous Once  . DISCONTD: sodium chloride   Intravenous STAT   Assessment: 40yo female c/o erythema to shin, found with leukocytosis, to begin IV ABX for cellulitis with transition to PO when responding to IV.  Goal of Therapy:  Vancomycin trough level 10-15 mcg/ml  Plan:  Will begin vancomycin 1500mg  IV Q24H and Cipro 400mg  IV Q12H.  Monitor CBC, Cx, levels prn.  F/U change to PO.  Colleen Can PharmD BCPS 10/14/2011,5:14 AM

## 2011-10-15 LAB — CBC
HCT: 35.6 % — ABNORMAL LOW (ref 36.0–46.0)
Hemoglobin: 11.6 g/dL — ABNORMAL LOW (ref 12.0–15.0)
MCHC: 32.6 g/dL (ref 30.0–36.0)
RBC: 4.1 MIL/uL (ref 3.87–5.11)
WBC: 8 10*3/uL (ref 4.0–10.5)

## 2011-10-15 LAB — LIPID PANEL
HDL: 22 mg/dL — ABNORMAL LOW (ref >39)
LDL Cholesterol: 83 mg/dL (ref 0–99)
Total CHOL/HDL Ratio: 7.2 RATIO
Triglycerides: 272 mg/dL — ABNORMAL HIGH (ref <150)

## 2011-10-15 LAB — TSH: TSH: 0.981 u[IU]/mL (ref 0.350–4.500)

## 2011-10-15 LAB — GLUCOSE, CAPILLARY
Glucose-Capillary: 281 mg/dL — ABNORMAL HIGH (ref 70–99)
Glucose-Capillary: 196 mg/dL — ABNORMAL HIGH (ref 70–99)

## 2011-10-15 LAB — BASIC METABOLIC PANEL
BUN: 8 mg/dL (ref 6–23)
Chloride: 103 mEq/L (ref 96–112)
GFR calc non Af Amer: >90 mL/min (ref >90)
Glucose, Bld: 245 mg/dL — ABNORMAL HIGH (ref 70–99)
Potassium: 4.1 mEq/L (ref 3.5–5.1)
Sodium: 137 mEq/L (ref 135–145)

## 2011-10-15 MED ORDER — DOCUSATE SODIUM 100 MG PO CAPS
100.0000 mg | ORAL_CAPSULE | Freq: Every day | ORAL | Status: DC
  Administered 2011-10-15 – 2011-10-17 (×3): 100 mg via ORAL
  Filled 2011-10-15 (×4): qty 1

## 2011-10-15 MED ORDER — INSULIN GLARGINE 100 UNIT/ML ~~LOC~~ SOLN
15.0000 [IU] | Freq: Every day | SUBCUTANEOUS | Status: DC
  Administered 2011-10-15 – 2011-10-16 (×2): 15 [IU] via SUBCUTANEOUS
  Filled 2011-10-15: qty 3

## 2011-10-15 MED ORDER — NYSTATIN 100000 UNITS VA TABS
1.0000 | ORAL_TABLET | Freq: Every day | VAGINAL | Status: DC
  Filled 2011-10-15 (×4): qty 1

## 2011-10-15 MED ORDER — INSULIN ASPART 100 UNIT/ML ~~LOC~~ SOLN
4.0000 [IU] | Freq: Three times a day (TID) | SUBCUTANEOUS | Status: DC
  Administered 2011-10-15 – 2011-10-17 (×7): 4 [IU] via SUBCUTANEOUS
  Filled 2011-10-15: qty 3

## 2011-10-15 MED ORDER — INSULIN ASPART 100 UNIT/ML ~~LOC~~ SOLN
0.0000 [IU] | Freq: Three times a day (TID) | SUBCUTANEOUS | Status: DC
  Administered 2011-10-15: 3 [IU] via SUBCUTANEOUS
  Administered 2011-10-15: 5 [IU] via SUBCUTANEOUS
  Administered 2011-10-16: 3 [IU] via SUBCUTANEOUS
  Administered 2011-10-16 – 2011-10-17 (×3): 5 [IU] via SUBCUTANEOUS
  Administered 2011-10-17: 3 [IU] via SUBCUTANEOUS
  Administered 2011-10-17: 8 [IU] via SUBCUTANEOUS
  Filled 2011-10-15: qty 3

## 2011-10-15 NOTE — Progress Notes (Signed)
Subjective: Patient seen and examined this am. Informs feeling better but still has  some pain over Rt leg  Objective:  Vital signs in last 24 hours:  Filed Vitals:   10/14/11 0626 10/14/11 1328 10/14/11 2308 10/15/11 0507  BP: 130/80 128/84 135/86 113/76  Pulse: 95 92 93 77  Temp: 98.6 F (37 C) 97 F (36.1 C) 99 F (37.2 C) 98.3 F (36.8 C)  TempSrc:  Oral Oral Oral  Resp: 20 18 18 20   Height:      Weight:      SpO2: 100% 97% 96% 95%    Intake/Output from previous day:   Intake/Output Summary (Last 24 hours) at 10/15/11 1232 Last data filed at 10/14/11 2136  Gross per 24 hour  Intake   1817 ml  Output      0 ml  Net   1817 ml    Physical Exam:  General: middle aged female in no acute distress.  HEENT:  No pallor, moist oral mucosa Heart: Regular rate and rhythm, without murmurs, rubs, gallops.  Lungs: Clear to auscultation bilaterally.  Abdomen: Soft, nontender, nondistended, positive bowel sounds.  Extremities: warm to touch, tender and with erythema on her RLE with some improvement in extent of erythema from admission.  no edema.  Neuro: AAOX 3 Grossly intact, nonfocal.   Lab Results:  Basic Metabolic Panel:    Component Value Date/Time   NA 137 10/15/2011 0525   K 4.1 10/15/2011 0525   CL 103 10/15/2011 0525   CO2 24 10/15/2011 0525   BUN 8 10/15/2011 0525   CREATININE 0.66 10/15/2011 0525   GLUCOSE 245* 10/15/2011 0525   CALCIUM 8.7 10/15/2011 0525   CBC:    Component Value Date/Time   WBC 8.0 10/15/2011 0525   HGB 11.6* 10/15/2011 0525   HCT 35.6* 10/15/2011 0525   PLT 245 10/15/2011 0525   MCV 86.8 10/15/2011 0525   NEUTROABS 8.1* 10/14/2011 0107   LYMPHSABS 3.7 10/14/2011 0107   MONOABS 0.8 10/14/2011 0107   EOSABS 0.1 10/14/2011 0107   BASOSABS 0.0 10/14/2011 0107    No results found for this or any previous visit (from the past 240 hour(s)).  Studies/Results: Ct Head Wo Contrast  10/14/2011  *RADIOLOGY REPORT*  Clinical Data:  40 year old female with headache.  Cellulitis.  CT HEAD WITHOUT CONTRAST  Technique:  Contiguous axial images were obtained from the base of the skull through the vertex without contrast.  Comparison: None.  Findings: Visualized paranasal sinuses and mastoids are clear.  No acute osseous abnormality identified.  Visualized orbits and scalp soft tissues are within normal limits.  Cerebral volume is within normal limits for age.  No midline shift, ventriculomegaly, mass effect, evidence of mass lesion, intracranial hemorrhage or evidence of cortically based acute infarction.  Gray-white matter differentiation is within normal limits throughout the brain.  No suspicious intracranial vascular hyperdensity.   IMPRESSION: Normal noncontrast CT appearance of the brain.  Original Report Authenticated By: Harley Hallmark, M.D.    Medications: Scheduled Meds:   . docusate sodium  100 mg Oral Daily  . enoxaparin (LOVENOX) injection  40 mg Subcutaneous Q24H  . insulin aspart  0-15 Units Subcutaneous TID WC  . insulin aspart  4 Units Subcutaneous TID WC  . insulin glargine  15 Units Subcutaneous QHS  . nystatin  1 tablet Vaginal QHS  . piperacillin-tazobactam (ZOSYN)  IV  3.375 g Intravenous Q8H  . vancomycin  1,500 mg Intravenous Q24H  . DISCONTD: ciprofloxacin  400 mg Intravenous Q12H  . DISCONTD: insulin aspart  0-9 Units Subcutaneous TID WC  . DISCONTD: insulin glargine  10 Units Subcutaneous QHS   Continuous Infusions:   . DISCONTD: sodium chloride 125 mL/hr at 10/15/11 0339   PRN Meds:.acetaminophen, acetaminophen, ondansetron (ZOFRAN) IV, ondansetron, oxyCODONE  Assessment 40 y/o female with no past medical hx presented with cellulitis of RLE and found to have new onset DM.   Plan:  1-Right lower extremity Cellulitis:  continue IV antibiotics (vanc and zosyn) started on 12/24  IVF's and pain medications as needed.  D dimer negative. Checking doppler LE to r/o DVT  2-Headache:  resolved  after CBG's better controlled and pain meds. CT head  negative   3-Hyperglycemia:secondary to DM mellitus; Newly diagnosed this admission  A1C 12.4. Continue SSI and Lantus while in the hospital for tight control. Consulted  Diabetes team education  hypertriglyceridemia noted TSH wnl   4 Leukocytosis:  Resolved. Cont abx  DVT prophylaxis  lovenox   patient does not have PCP . She informs having medicaid. Need to discuss with CM on requirements for discharge. Would need to go home on oral hypoglycemics and insulin given high A1C       LOS: 2 days   Adriana Preston 10/15/2011, 12:32 PM

## 2011-10-15 NOTE — Progress Notes (Signed)
Patient c/o yeast infection. 

## 2011-10-16 LAB — GLUCOSE, CAPILLARY
Glucose-Capillary: 228 mg/dL — ABNORMAL HIGH (ref 70–99)
Glucose-Capillary: 197 mg/dL — ABNORMAL HIGH (ref 70–99)

## 2011-10-16 MED ORDER — LIVING WELL WITH DIABETES BOOK
Freq: Once | Status: AC
  Administered 2011-10-16: 16:00:00
  Filled 2011-10-16: qty 1

## 2011-10-16 NOTE — Progress Notes (Signed)
Inpatient Diabetes Program Recommendations  AACE/ADA: New Consensus Statement on Inpatient Glycemic Control (2009)  Target Ranges:  Prepandial:   less than 140 mg/dL      Peak postprandial:   less than 180 mg/dL (1-2 hours)      Critically ill patients:  140 - 180 mg/dL   Reason: new-onset DM  Inpatient Diabetes Program Recommendations Insulin - Basal: increase Lantus to 20 units HgbA1C: =12.4 Outpatient Referral: Needs order for OP education at [facility]  RN will begin basic DM education/survival skills. Will continue to follow during this admission.   Thanks, Piedad Climes RN, Diabetes Coordinator

## 2011-10-16 NOTE — Progress Notes (Signed)
Physical Therapy Evaluation Patient Details Name: Adriana Preston MRN: [id number] DOB: [birth year] Today's Date: 10/16/2011  Problem List:  Patient Active Problem List  Diagnoses  . ALLERGIC RHINITIS  . Cellulitis of lower leg  . Headache  . Hyperglycemia    Past Medical History:  Past Medical History  Diagnosis Date  . Headache, classical migraine    Past Surgical History:  Past Surgical History  Procedure Date  . Breast surgery   . Tubal ligation     PT Assessment/Plan/Recommendation PT Assessment Clinical Impression Statement: Pt presents with a medical diagnosis of right cellulitis and increased pain limiting weight bearing. Pt will benefit from skilled PT in the acute care setting in order to maximize functional mobility for a safe d/c home PT Recommendation/Assessment: Patient will need skilled PT in the acute care venue PT Problem List: Decreased activity tolerance;Decreased mobility;Decreased knowledge of use of DME;Pain PT Therapy Diagnosis : Abnormality of gait;Acute pain PT Plan PT Frequency: Min 3X/week PT Treatment/Interventions: DME instruction;Gait training;Stair training;Functional mobility training;Therapeutic activities;Therapeutic exercise;Patient/family education PT Recommendation Follow Up Recommendations: None Equipment Recommended: Rolling walker with 5" wheels PT Goals  Acute Rehab PT Goals PT Goal Formulation: With patient Time For Goal Achievement: 7 days Pt will go Sit to Stand: with modified independence PT Goal: Sit to Stand - Progress: Progressing toward goal Pt will go Stand to Sit: with modified independence PT Goal: Stand to Sit - Progress: Progressing toward goal Pt will Transfer Bed to Chair/Chair to Bed: with modified independence PT Transfer Goal: Bed to Chair/Chair to Bed - Progress: Progressing toward goal Pt will Ambulate: >150 feet;with modified independence;with least restrictive assistive device PT Goal: Ambulate - Progress:  Progressing toward goal Pt will Go Up / Down Stairs: 3-5 stairs;with min assist;with least restrictive assistive device PT Goal: Up/Down Stairs - Progress: Progressing toward goal  PT Evaluation Precautions/Restrictions  Restrictions Weight Bearing Restrictions: No Prior Functioning  Home Living Lives With: Son;Daughter Receives Help From: Family Type of Home: House Home Layout: One level Home Access: Stairs to enter Entrance Stairs-Rails: Right Entrance Stairs-Number of Steps: 3 Bathroom Shower/Tub: Psychologist, counselling;Door [employer] Toilet: Standard Bathroom Accessibility: No Home Adaptive Equipment: Crutches Prior Function Level of Independence: Independent with basic ADLs;Independent with gait;Independent with transfers;Independent with homemaking with ambulation Able to Take Stairs?: Yes Driving: Yes Vocation: Full time employment Cognition Cognition Arousal/Alertness: Awake/alert Overall Cognitive Status: Appears within functional limits for tasks assessed Sensation/Coordination Sensation Light Touch: Appears Intact Extremity Assessment RLE Assessment RLE Assessment: Exceptions to [facility] RLE AROM (degrees) Overall AROM Right Lower Extremity: Deficits;Due to pain (Hip and knee WFL) RLE Strength RLE Overall Strength: Deficits;Due to pain (Hip and Knee WFL; Ankle NT) LLE Assessment LLE Assessment: Within Functional Limits Mobility (including Balance) Bed Mobility Bed Mobility: Yes Supine to Sit: 6: Modified independent (Device/Increase time) Sitting - Scoot to Edge of Bed: 6: Modified independent (Device/Increase time) Transfers Transfers: Yes Sit to Stand: 4: Min assist;With upper extremity assist;From bed Sit to Stand Details (indicate cue type and reason): VC for hand placement for safety to RW Stand to Sit: 4: Min assist;With upper extremity assist;To chair/3-in-1 Stand to Sit Details: VC for hand placement for safety Ambulation/Gait Ambulation/Gait:  Yes Ambulation/Gait Assistance: 5: Supervision Ambulation/Gait Assistance Details (indicate cue type and reason): VC for proper technique with RW Ambulation Distance (Feet): 50 Feet Assistive device: Rolling walker (Pt feels more comfortable with stability of RW) Gait Pattern: Step-to pattern;Decreased weight shift to right;Decreased stance time - right;Decreased step length - left Gait  velocity: Decreased gait speed Stairs: Yes Stairs Assistance: 4: Min assist Stairs Assistance Details (indicate cue type and reason): VC for proper stair sequencing Stair Management Technique: Backwards;With walker;Step to pattern Number of Stairs: 4     Exercise    End of Session PT - End of Session Equipment Utilized During Treatment: Gait belt Activity Tolerance: Patient tolerated treatment well Patient left: in chair Nurse Communication: Mobility status for transfers;Mobility status for ambulation General Behavior During Session: [facility] for tasks performed Cognition: [facility] for tasks performed  Milana Kidney 10/16/2011, 1:11 PM  10/16/2011 Milana Kidney DPT PAGER: [phone] OFFICE: [phone]

## 2011-10-16 NOTE — Progress Notes (Signed)
Patient ID: Adriana Preston, female   DOB: [birth year], 40 y.o.   MRN: [id number] Subjective: No events overnight. Patient denies chest pain, shortness of breath, abdominal pain.   Objective:  Vital signs in last 24 hours:  Filed Vitals:   10/15/11 1331 10/15/11 2200 10/16/11 0615 10/16/11 1407  BP: 117/42 122/80 110/62 125/69  Pulse: 90 79 79 86  Temp: 98.4 F (36.9 C) 98.5 F (36.9 C) 98.5 F (36.9 C) 98.5 F (36.9 C)  TempSrc:  Oral Oral   Resp: 20 20 20 18   Height:      Weight:      SpO2: 99% 100% 99% 100%    Intake/Output from previous day:   Intake/Output Summary (Last 24 hours) at 10/16/11 1752 Last data filed at 10/16/11 1300  Gross per 24 hour  Intake    240 ml  Output    200 ml  Net     40 ml    Physical Exam: General: Alert, awake, oriented x3, in no acute distress. HEENT: No bruits, no goiter. Moist mucous membranes, no scleral icterus, no conjunctival pallor. Heart: Regular rate and rhythm, S1/S2 +, no murmurs, rubs, gallops. Lungs: Clear to auscultation bilaterally. No wheezing, no rhonchi, no rales.  Abdomen: Soft, nontender, nondistended, positive bowel sounds. Extremities: right lower extremity erythema improving; pulses palpable Neuro: Grossly nonfocal.  Lab Results:  Basic Metabolic Panel:    Component Value Date/Time   NA 137 10/15/2011 0525   K 4.1 10/15/2011 0525   CL 103 10/15/2011 0525   CO2 24 10/15/2011 0525   BUN 8 10/15/2011 0525   CREATININE 0.66 10/15/2011 0525   GLUCOSE 245* 10/15/2011 0525   CALCIUM 8.7 10/15/2011 0525   CBC:    Component Value Date/Time   WBC 8.0 10/15/2011 0525   HGB 11.6* 10/15/2011 0525   HCT 35.6* 10/15/2011 0525   PLT 245 10/15/2011 0525   MCV 86.8 10/15/2011 0525   NEUTROABS 8.1* 10/14/2011 0107   LYMPHSABS 3.7 10/14/2011 0107   MONOABS 0.8 10/14/2011 0107   EOSABS 0.1 10/14/2011 0107   BASOSABS 0.0 10/14/2011 0107      Lab 10/15/11 0525 10/14/11 0852 10/14/11 0107  WBC 8.0 12.0* 12.8*    HGB 11.6* 13.0 13.6  HCT 35.6* 39.0 40.2  PLT 245 256 274  MCV 86.8 86.1 85.9  MCH 28.3 28.7 29.1  MCHC 32.6 33.3 33.8  RDW 12.2 12.4 12.3  LYMPHSABS -- -- 3.7  MONOABS -- -- 0.8  EOSABS -- -- 0.1  BASOSABS -- -- 0.0  BANDABS -- -- --    Lab 10/15/11 0525 10/14/11 0852 10/14/11 0107  NA 137 134* 130*  K 4.1 3.5 4.0  CL 103 100 95*  CO2 24 26 26   GLUCOSE 245* 335* 438*  BUN 8 10 13   CREATININE 0.66 0.58 0.77  CALCIUM 8.7 9.0 9.6  MG -- -- --   No results found for this basename: INR:5,PROTIME:5 in the last 168 hours Cardiac markers: No results found for this basename: CK:3,CKMB:3,TROPONINI:3,MYOGLOBIN:3 in the last 168 hours No components found with this basename: POCBNP:3 No results found for this or any previous visit (from the past 240 hour(s)).  Studies/Results: No results found.  Medications: Scheduled Meds:   . docusate sodium  100 mg Oral Daily  . enoxaparin (LOVENOX) injection  40 mg Subcutaneous Q24H  . insulin aspart  0-15 Units Subcutaneous TID WC  . insulin aspart  4 Units Subcutaneous TID WC  . insulin glargine  15 Units  Subcutaneous QHS  . living well with diabetes book   Does not apply Once  . nystatin  1 tablet Vaginal QHS  . piperacillin-tazobactam (ZOSYN)  IV  3.375 g Intravenous Q8H  . vancomycin  1,500 mg Intravenous Q24H   Continuous Infusions:  PRN Meds:.acetaminophen, acetaminophen, ondansetron (ZOFRAN) IV, ondansetron, oxyCODONE  Assessment/Plan:  Principal Problem:   *Cellulitis of lower leg - improving - continue IV antibiotics (vanc and zosyn) started on 12/24  - IVF's and pain medications as needed.   Active Problems:  Hyperglycemia:secondary to DM mellitus;  - diabetic education   EDUCATION - test results and diagnostic studies were discussed with patient and pt's family who was present at the bedside - patient and family have verbalized the understanding - questions were answered at the bedside and contact information  was provided for additional questions or concerns   LOS: 3 days   Adriana Preston 10/16/2011, 5:52 PM  [facility] Pager: [phone]

## 2011-10-17 LAB — GLUCOSE, CAPILLARY
Glucose-Capillary: 238 mg/dL — ABNORMAL HIGH (ref 70–99)
Glucose-Capillary: 281 mg/dL — ABNORMAL HIGH (ref 70–99)

## 2011-10-17 MED ORDER — DSS 100 MG PO CAPS: 100.0000 mg | ORAL_CAPSULE | Freq: Every day | ORAL | Status: AC

## 2011-10-17 MED ORDER — INSULIN GLARGINE 100 UNIT/ML ~~LOC~~ SOLN: 17.0000 [IU] | Freq: Every day | SUBCUTANEOUS | Status: DC

## 2011-10-17 MED ORDER — SULFAMETHOXAZOLE-TRIMETHOPRIM 800-160 MG PO TABS: 1.0000 | ORAL_TABLET | Freq: Two times a day (BID) | ORAL | Status: AC

## 2011-10-17 MED ORDER — CLOTRIMAZOLE 2 % VA CREA
1.0000 | TOPICAL_CREAM | Freq: Every day | VAGINAL | Status: DC
  Filled 2011-10-17: qty 22.2

## 2011-10-17 MED ORDER — INSULIN ASPART 100 UNIT/ML ~~LOC~~ SOLN: 6.0000 [IU] | Freq: Three times a day (TID) | SUBCUTANEOUS | Status: DC

## 2011-10-17 MED ORDER — INSULIN PEN STARTER KIT
1.0000 | Freq: Once | Status: DC
  Filled 2011-10-17: qty 1

## 2011-10-17 MED ORDER — CLOTRIMAZOLE 2 % VA CREA: 1.0000 | TOPICAL_CREAM | Freq: Every day | VAGINAL | Status: AC

## 2011-10-17 MED ORDER — OXYCODONE-ACETAMINOPHEN 5-325 MG PO TABS: 2.0000 | ORAL_TABLET | Freq: Four times a day (QID) | ORAL | Status: AC | PRN

## 2011-10-17 MED ORDER — INSULIN PEN STARTER KIT: 1.0000 | Freq: Once | Status: DC

## 2011-10-17 NOTE — Progress Notes (Signed)
Physical Therapy Treatment Patient Details Name: Adriana Preston MRN: [id number] DOB: Apr 29, 1971 Today's Date: 10/17/2011  PT Assessment/Plan  PT - Assessment/Plan Comments on Treatment Session: Pt is at a supervision-modified independence level for all mobility. Pt is safe to d/c home today with supervision and assist for stairs. PT Plan: Discharge plan remains appropriate PT Frequency: Min 3X/week Follow Up Recommendations: None Equipment Recommended: Rolling walker with 5" wheels PT Goals  Acute Rehab PT Goals PT Goal Formulation: With patient PT Goal: Sit to Stand - Progress: Met PT Goal: Stand to Sit - Progress: Partly met PT Transfer Goal: Bed to Chair/Chair to Bed - Progress: Met PT Goal: Ambulate - Progress: Progressing toward goal PT Goal: Up/Down Stairs - Progress: Met  PT Treatment Precautions/Restrictions  Restrictions Weight Bearing Restrictions: No Mobility (including Balance) Bed Mobility Bed Mobility: Yes Supine to Sit: 6: Modified independent (Device/Increase time) Sitting - Scoot to Edge of Bed: 6: Modified independent (Device/Increase time) Sit to Supine - Right: 6: Modified independent (Device/Increase time) Transfers Transfers: Yes Sit to Stand: 6: Modified independent (Device/Increase time) Stand to Sit: 5: Supervision Stand to Sit Details: VC for hand placement for safety upon sitting Ambulation/Gait Ambulation/Gait: Yes Ambulation/Gait Assistance: 5: Supervision Ambulation/Gait Assistance Details (indicate cue type and reason): Supervision for safety secondary to difficulty with Right weight bearing Ambulation Distance (Feet): 100 Feet Assistive device: Rolling walker Gait Pattern: Step-to pattern;Decreased weight shift to right;Decreased stance time - right;Decreased step length - left Gait velocity: Decreased gait speed Stairs: Yes Stairs Assistance: 5: Supervision Stairs Assistance Details (indicate cue type and reason): Educated pt and son on  proper stair sequencing. Son assisted pt up stairs with supervision for safety Stair Management Technique: Backwards;With walker;Step to pattern Number of Stairs: 2     Exercise    End of Session PT - End of Session Equipment Utilized During Treatment: Gait belt Activity Tolerance: Patient tolerated treatment well Patient left: in bed Nurse Communication: Mobility status for transfers;Mobility status for ambulation General Behavior During Session: [facility] for tasks performed Cognition: [facility] for tasks performed  Milana Kidney 10/17/2011, 3:10 PM  10/17/2011 Milana Kidney DPT PAGER: [phone] OFFICE: [phone]

## 2011-10-17 NOTE — Progress Notes (Signed)
ANTIBIOTIC CONSULT NOTE - FOLLOW UP  Pharmacy Consult for Vancomycin & Zosyn Indication: cellulitis  Assessment: 40 yo F continues on IV antibiotics for RLE cellulitis.  Today is day#4 of Vanc/Zosyn therapy.  Afeb, WBC nl, no cx data at this time.  Renal fxn remains stable, with CrCl~ 79.  Goal of Therapy:  Vancomycin trough level 10-15 mcg/ml  Plan:  Hopeful for continued improvement and change to oral antibiotics.  Will hold off on Vancomycin trough at this time.  Will need level if therapy continues >7 days.   --System Review--  Allergies  Allergen Reactions  . Banana Swelling  . Penicillins Swelling  . Tomato Swelling    Patient Measurements: Height: 5\' 7"  (170.2 cm) Weight: 117 lb 9.6 oz (53.343 kg) IBW/kg (Calculated) : 61.6   Vital Signs: Temp: 97.8 F (36.6 C) (12/27 0536) BP: 123/77 mmHg (12/27 0536) Pulse Rate: 76  (12/27 0536) Intake/Output from previous day: 12/26 0701 - 12/27 0700 In: 2330 [P.O.:480; IV Piggyback:1850] Out: -  Intake/Output from this shift: Total I/O In: 360 [P.O.:360] Out: -   Labs:  [facility] 10/15/11 0525  WBC 8.0  HGB 11.6*  PLT 245  LABCREA --  CREATININE 0.66   Estimated Creatinine Clearance: 79.4 ml/min (by C-G formula based on Cr of 0.66).  Microbiology: No results found for this or any previous visit (from the past 720 hour(s)).  Anti-infectives     Start     Dose/Rate Route Frequency Ordered Stop   10/14/11 1930  piperacillin-tazobactam (ZOSYN) IVPB 3.375 g       3.375 g 12.5 mL/hr over 240 Minutes Intravenous Every 8 hours 10/14/11 1906     10/14/11 0800   ciprofloxacin (CIPRO) IVPB 400 mg  Status:  Discontinued        400 mg 200 mL/hr over 60 Minutes Intravenous Every 12 hours 10/14/11 0517 10/15/11 0747   10/14/11 0600   vancomycin (VANCOCIN) 1,500 mg in sodium chloride 0.9 % 500 mL IVPB        1,500 mg 250 mL/hr over 120 Minutes Intravenous Every 24 hours 10/14/11 0517     10/14/11 0115   ceFAZolin  (ANCEF) IVPB 1 g/50 mL premix        1 g 100 mL/hr over 30 Minutes Intravenous  Once 10/14/11 0107 10/14/11 0228          Admit Complaint: migraine, R leg swelling, cellulitis  Pharmacist System-Based Medication Review:  Anticoag: Doppler of RLE with no evidence of DVT ID: Cellulitis: D4 Vanc/Zosyn. Pt reports allergy (PCN=swelling), MD aware, no swelling noted during this abx course.  Afeb, WBC 8 (decreased). Pt c/o yeast infection --> added clotrimazole. Follow up change to PO abx.   Cards: VSS; no cards hx. Elev TG 2/2 DM, low HDL; TSH wnl Endo: new dx DM this admit (A1c 12.4); CBGs improving but still >200; on mod SSI, 4 units with meals; Lantus 15 untis HS; DM diet education completed GI/ Nutrition: Carb mod. diet Neuro: admit with migraine (hx migraines), numbness; resolved. Nephro: Scr 0.66; CrCl ~ 79 ml/min, lytes wnl (10/15/11) Pulm: 95% RA Heme/Onc: Hgb decr to 11.6, likely dilutional, plt stable PTA Medication Issues: none Best Practices: Enox 40 SQ daily  [employer], Pharm.D., BCPS Clinical Pharmacist Pager [phone]  10/17/2011,11:53 AM

## 2011-10-17 NOTE — Plan of Care (Signed)
Problem: Food- and Nutrition-Related Knowledge Deficit (NB-1.1) Goal: Nutrition education Formal process to instruct or train a patient/client in a skill or to impart knowledge to help patients/clients voluntarily manage or modify food choices and eating behavior to maintain or improve health.  Outcome: Completed/Met Date Met:  10/17/11 RD consult for DM diet education. Reviewed guidelines and recommendations. Handouts provided form [employer]. Pt currently on a Carbohydrate Modified Medium Calorie diet with 75-100% PO intake. Weight in computer is incorrect. Pt likely 117 kg not 117 lb per visual assessment. No further nutrition intervention warranted at this time.

## 2011-10-17 NOTE — Discharge Summary (Signed)
Patient ID: Adriana Preston MRN: [id number] DOB/AGE: Apr 07, 1971 40 y.o.  Admit date: 10/13/2011 Discharge date: 10/17/2011  Primary Care Physician:  No primary provider on file.  Discharge Diagnoses:    Present on Admission:  **None**  Principal Problem:  *Cellulitis of lower leg Active Problems:  Headache  Hyperglycemia   Current Discharge Medication List    START taking these medications   Details  clotrimazole (GYNE-LOTRIMIN 3) 2 % vaginal cream Place 1 Applicatorful vaginally at bedtime. Qty: 21 g, Refills: 0    docusate sodium 100 MG CAPS Take 100 mg by mouth daily. Qty: 30 capsule, Refills: 0    insulin aspart (NOVOLOG) 100 UNIT/ML injection Inject 6 Units into the skin 3 (three) times daily with meals. Qty: 1 vial, Refills: 3    insulin glargine (LANTUS) 100 UNIT/ML injection Inject 17 Units into the skin at bedtime. Qty: 10 mL, Refills: 3    oxyCODONE-acetaminophen (ROXICET) 5-325 MG per tablet Take 2 tablets by mouth every 6 (six) hours as needed for pain. Qty: 60 tablet, Refills: 0    sulfamethoxazole-trimethoprim (BACTRIM DS) 800-160 MG per tablet Take 1 tablet by mouth 2 (two) times daily. Qty: 28 tablet, Refills: 0        Disposition and Follow-up: PCP in 1 week; Endocrinology in 1 week  Consults:   1. Diabetic coordinator 2. Physical therapy  Significant Diagnostic Studies:  Ct Head Wo Contrast  10/14/2011 IMPRESSION: Normal noncontrast CT appearance of the brain.    Brief H and P: 40 year old female with no significant past medical history has come to the ER because of pain in the lower extremities. In the ER patient was found to have some erythema in the right anterior shin some leukocytosis. Patient has some subjective feeling of fever and chills. Pain but some pain relief medication headache is resolved. Patient states she history of migraines and it gets an attack every 3 months which gets relieved by NSAID's. Patient did not have any  dizziness or loss of consciousness or focal deficits. Patient patient was found to have hyperglycemia with blood sugars in the 400s but not in DKA. Patient does not have a history of diabetes.   Physical Exam on Discharge:  Filed Vitals:   10/16/11 0615 10/16/11 1407 10/16/11 2108 10/17/11 0536  BP: 110/62 125/69 118/72 123/77  Pulse: 79 86 82 76  Temp: 98.5 F (36.9 C) 98.5 F (36.9 C) 98.6 F (37 C) 97.8 F (36.6 C)  TempSrc: Oral     Resp: 20 18 20 20   Height:      Weight:      SpO2: 99% 100% 97% 95%     Intake/Output Summary (Last 24 hours) at 10/17/11 1210 Last data filed at 10/17/11 0900  Gross per 24 hour  Intake   2690 ml  Output      0 ml  Net   2690 ml    General: Alert, awake, oriented x3, in no acute distress. HEENT: No bruits, no goiter. Heart: Regular rate and rhythm, without murmurs, rubs, gallops. Lungs: Clear to auscultation bilaterally. Abdomen: Soft, nontender, nondistended, positive bowel sounds. Extremities: right lower extremity cellulitis improving Neuro: Grossly intact, nonfocal.  CBC:    Component Value Date/Time   WBC 8.0 10/15/2011 0525   HGB 11.6* 10/15/2011 0525   HCT 35.6* 10/15/2011 0525   PLT 245 10/15/2011 0525   MCV 86.8 10/15/2011 0525   NEUTROABS 8.1* 10/14/2011 0107   LYMPHSABS 3.7 10/14/2011 0107   MONOABS 0.8 10/14/2011 0107  EOSABS 0.1 10/14/2011 0107   BASOSABS 0.0 10/14/2011 0107    Basic Metabolic Panel:    Component Value Date/Time   NA 137 10/15/2011 0525   K 4.1 10/15/2011 0525   CL 103 10/15/2011 0525   CO2 24 10/15/2011 0525   BUN 8 10/15/2011 0525   CREATININE 0.66 10/15/2011 0525   GLUCOSE 245* 10/15/2011 0525   CALCIUM 8.7 10/15/2011 0525    Hospital Course:   Principal Problem:   *Cellulitis of lower leg - improving  with current antibiotics - cultures to date show no growth - patient will be discharged home with Bactrim DS 1 tablet 2 times a day for 14 days  Active Problems:    Hyperglycemia - new onset diabetes - diabetic education provided to patient while in hospital - nutrition counseling provided - patient will be discharged with Novolog 6 Units 3 times a day with meals and Lantus 17 units daily in morning   Education - patient  Is aware of plan of care and treatment - prescriptions were called in to pharmacy at [phone]   Disposition - patient is medically stable and clinically appears well to be discharged home  Time spent on Discharge: Greater than 30 minutes  Signed: Tristan Proto 10/17/2011, 12:10 PM

## 2011-10-24 ENCOUNTER — Ambulatory Visit: Payer: Medicaid Other | Admitting: *Deleted

## 2011-11-01 ENCOUNTER — Encounter: Payer: Self-pay | Admitting: *Deleted

## 2011-11-01 ENCOUNTER — Encounter: Payer: Medicaid Other | Attending: Internal Medicine | Admitting: *Deleted

## 2011-11-01 DIAGNOSIS — E669 Obesity, unspecified: Secondary | ICD-10-CM | POA: Insufficient documentation

## 2011-11-01 DIAGNOSIS — R7309 Other abnormal glucose: Secondary | ICD-10-CM | POA: Insufficient documentation

## 2011-11-01 DIAGNOSIS — R739 Hyperglycemia, unspecified: Secondary | ICD-10-CM

## 2011-11-01 NOTE — Patient Instructions (Addendum)
Plan: Aim for 2 Carbs / meal +/- 1, and 0-1 Carb/snack if hungry Read Food Labels for Total Carbohydrate of foods Continue taking Lantus in AM and Novolog pre-meal as directed by MD Continue storing unopened insulin in refrigerator, opened cartridges OK at room temperature for 28 days Consider options for increasing activity as tolerated Use 15 grams Carbohydrate to treat low BG as needed

## 2011-11-01 NOTE — Progress Notes (Signed)
  Medical Nutrition Therapy:  Appt start time: 0900 end time:  1030.   Assessment:  Primary concerns today: Patient newly diagnosed with Type 2 Diabetes when hospitalized for leg infection in December, 2012. She is currently on Lantus and Novolog and is restricting most forms of carbohydrate in an effort to control BGs. She has worked in [employer] but was laid off last week. Activity is limited due to her leg but she plans to walk and [place] as soon as possible.   MEDICATIONS: see list. Lantus current dose is 23 units in AM and Novolog 6 units before each meal   DIETARY INTAKE:  Usual eating pattern includes 3 meals and 1-2 snacks per day.  Everyday foods include good variety of all food groups.  Avoided foods include fried foods, cauliflower, oatmeal.    24-hr recall:  B ( AM): boiled egg white x 2,  1 whole wheat toast, grits, SF applesauce Snk ( AM): 1/2 cup plain cheerios, OR fresh or cup of fruit, OR SF jello  L ( PM): salad with grilled chicken, ranch dressing Snk ( PM): SF cookie or fruit D ( PM): vegetables x 2-3 and salad with baked chicken Snk ( PM): none Beverages: water, crystal light,   Usual physical activity: has walked with children a lot, plans to start [place] dancing as soon as leg heals   Estimated energy needs: 1400 calories 158 g carbohydrates 105 g protein 39 g fat  Progress Towards Goal(s):  In progress.   Nutritional Diagnosis:  NI-5.8.4 Inconsistent carbohydrate intake As related to new diagnosis of Type 2 Diabetes.  As evidenced by A1c of 12.7% at diagnosis.    Intervention:  Nutrition counseling and diabetes education provided including physiology of diabetes, symptoms and treatment for hyper and hypo glycemia, factors effecting BG, Carb Counting and rationale for consistent carb meals. Plan: Aim for 2 Carbs / meal +/- 1, and 0-1 Carb/snack if hungry Read Food Labels for Total Carbohydrate of foods Continue taking Lantus in AM and Novolog  pre-meal as directed by MD Continue storing unopened insulin in refrigerator, opened cartridges OK at room temperature for 28 days Consider options for increasing activity as tolerated Use 15 grams Carbohydrate to treat low BG as needed  Handouts given during visit include:  Patient already has copy of Living Well with Diabetes  Carb Counting and Label Reading handouts  Menu Plan Card and Menu Planner sheets  Monitoring/Evaluation:  Dietary intake, exercise, label reading , and body weight in 2 week(s).

## 2011-11-14 ENCOUNTER — Encounter: Payer: Self-pay | Admitting: *Deleted

## 2011-11-14 ENCOUNTER — Encounter: Payer: Medicaid Other | Admitting: *Deleted

## 2011-11-14 DIAGNOSIS — R739 Hyperglycemia, unspecified: Secondary | ICD-10-CM

## 2011-11-14 NOTE — Patient Instructions (Signed)
Plan: Continue with meal plan of 1-2 Carb choices (15-30 grams) per meal Continue checking BG 4 times per day pre-meal and at bedtime as directed by MD  Continue taking 25 units Lantus at bedtime and 6 units Novolog pre meal  Continue with plan to increase activity thru [place] dancing daily starting with a few minutes and increasing as tolerated Other activity plan is to walk with friend 3 times a week either in neighborhood or at mall if bad weather If BG drops below 90 in AM, call MD to inform and see what reduction in insulin dose they prescribe One week before next visit, complete 3 days of BG collection pre and post meal on Accu Chek 360' View Tool Sheet and bring to visit

## 2011-11-14 NOTE — Progress Notes (Signed)
  Medical Nutrition Therapy:  Appt start time: 1100 end time:  1200.   Assessment:  Primary concerns today: Follow up visit for weight loss and diabetes education. Although no weight loss, her activity level has been limited due to leg injury. She has made major changes in her eating habits, stating an average of 1-2 Carb Choices per meal (15-30 grams). Her leg is better and she has been cleared to start exercising. She has specific plans to [place] dance with her family daily and to walk with her friend 3 days a week. Her BG are significantly better ranging from 100-150 a majority of the time.  MEDICATIONS: see list. Lantus current dose is 25 units in AM and Novolog 6 units before each meal   DIETARY INTAKE:  Usual eating pattern includes 3 meals and 1-2 snacks per day.  Everyday foods include good variety of all food groups.  Avoided foods include fried foods, cauliflower, oatmeal.    24-hr recall:  B ( AM): boiled egg white x 2, with 1 whole wheat toast or grits, or SF applesauce Snk ( AM): 1/2 cup plain cheerios, OR fresh or cup of fruit, OR SF jello  L ( PM): salad with grilled chicken, ranch dressing Snk ( PM): SF cookie or fruit D ( PM): vegetables x 2-3 and salad with baked chicken Snk ( PM): none Beverages: water, crystal light,   Usual physical activity: has walked with children a lot, plans to start [place]omb[place] dancing now  Estimated energy needs: 1400 calories 158 g carbohydrates 105 g protein 39 g fat  Progress Towards Goal(s):  In progress.   Nutritional Diagnosis:  NI-5.8.4 Inconsistent carbohydrate intake As related to new diagnosis of Type 2 Diabetes.  As evidenced by A1c of 12.7% at diagnosis.    Intervention:   Nutrition counseling and diabetes education provided including understanding effect of insulin and increased activity in lowering BG. Be aware that if BG too low, to contact MD regarding lowering insulin dose so won't have to repeatedly eat food to treat the  lows. Pleased with BG management and carb counting. BG range per log book is mainly 100-150 mg/dl with all BGs below 119. Plan: Continue with meal plan of 1-2 Carb choices (15-30 grams) per meal Continue checking BG 4 times per day pre-meal and at bedtime as directed by MD  Continue taking 25 units Lantus at bedtime and 6 units Novolog pre meal  Continue with plan to increase activity thru [place] dancing daily starting with a few minutes and increasing as tolerated Other activity plan is to walk with friend 3 times a week either in neighborhood or at mall if bad weather If BG drops below 90 in AM, call MD to inform and see what reduction in insulin dose they prescribe One week before next visit, complete 3 days of BG collection pre and post meal on Accu Chek 360' View Tool Sheet and bring to visit  Handouts given during visit include:  Accu Chek 360' View Tool Sheet  Monitoring/Evaluation:  Dietary intake, exercise, assess BG Tool  , and body weight in 4 week(s).

## 2011-12-16 ENCOUNTER — Ambulatory Visit: Payer: Medicaid Other | Admitting: *Deleted

## 2012-03-11 ENCOUNTER — Other Ambulatory Visit: Payer: Self-pay | Admitting: Family Medicine

## 2012-03-11 DIAGNOSIS — N63 Unspecified lump in unspecified breast: Secondary | ICD-10-CM

## 2012-03-19 ENCOUNTER — Ambulatory Visit
Admission: RE | Admit: 2012-03-19 | Discharge: 2012-03-19 | Disposition: A | Discharge status: Home / Self Care | Payer: Medicaid Other | Source: Ambulatory Visit | Attending: Family Medicine | Admitting: Family Medicine

## 2012-03-19 DIAGNOSIS — N63 Unspecified lump in unspecified breast: Secondary | ICD-10-CM

## 2012-09-23 ENCOUNTER — Encounter (HOSPITAL_COMMUNITY): Payer: Self-pay

## 2012-09-23 ENCOUNTER — Emergency Department (HOSPITAL_COMMUNITY)
Admission: EM | Admit: 2012-09-23 | Discharge: 2012-09-24 | Disposition: A | Discharge status: Home / Self Care | Payer: Medicaid Other | Attending: Emergency Medicine | Admitting: Emergency Medicine

## 2012-09-23 ENCOUNTER — Emergency Department (HOSPITAL_COMMUNITY): Payer: Medicaid Other

## 2012-09-23 DIAGNOSIS — Z8679 Personal history of other diseases of the circulatory system: Secondary | ICD-10-CM | POA: Insufficient documentation

## 2012-09-23 DIAGNOSIS — E119 Type 2 diabetes mellitus without complications: Secondary | ICD-10-CM | POA: Insufficient documentation

## 2012-09-23 DIAGNOSIS — Z794 Long term (current) use of insulin: Secondary | ICD-10-CM | POA: Insufficient documentation

## 2012-09-23 DIAGNOSIS — M79644 Pain in right finger(s): Secondary | ICD-10-CM

## 2012-09-23 DIAGNOSIS — M79609 Pain in unspecified limb: Secondary | ICD-10-CM | POA: Insufficient documentation

## 2012-09-23 DIAGNOSIS — Z87891 Personal history of nicotine dependence: Secondary | ICD-10-CM | POA: Insufficient documentation

## 2012-09-23 HISTORY — DX: Type 2 diabetes mellitus without complications: E11.9

## 2012-09-23 NOTE — ED Notes (Signed)
Pt reports (R) hand pain and swelling x4 days, pt denies any injury.

## 2012-09-23 NOTE — ED Notes (Signed)
Pt states that 2 days ago her R hand began hurting. Pt states that 3-4 days ago pain began, but it has been increasingly worsening to the point that she was awakened from sleep. Pt denies any trauma to R hand.

## 2012-09-23 NOTE — ED Provider Notes (Signed)
History     CSN: [id number]  Arrival date & time 09/23/12  2155   First MD Initiated Contact with Patient 09/23/12 2353      Chief Complaint  Patient presents with  . Hand Pain    (Consider location/radiation/quality/duration/timing/severity/associated sxs/prior treatment) HPI History provided by pt.   Pt c/o 2 days gradually worsening, severe pain in right middle finger.  Aggravated by palpation and flexion and improves w/ massage of hand.  Associated w/ finger edema.  Denies fever and skin changes.  Denies trauma.   Past Medical History  Diagnosis Date  . Headache, classical migraine   . Diabetes mellitus without complication     Past Surgical History  Procedure Date  . Breast surgery   . Tubal ligation     Family History  Problem Relation Age of Onset  . Hypertension Mother   . Aneurysm Mother   . Coronary aneurysm Mother   . Hypotension Mother   . Hypertension Father   . Glaucoma Father   . Hypotension Father     History  Substance Use Topics  . Smoking status: Former Smoker    Quit date: 05/23/2011  . Smokeless tobacco: Former Neurosurgeon    Quit date: 05/23/2011  . Alcohol Use: Yes     Comment: less than once a quarter, if so, mixed drink    OB History    Grav Para Term Preterm Abortions TAB SAB Ect Mult Living                  Review of Systems  All other systems reviewed and are negative.    Allergies  Banana; Onion; Penicillins; and Tomato  Home Medications   Current Outpatient Rx  Name  Route  Sig  Dispense  Refill  . ACETAMINOPHEN 500 MG PO TABS   Oral   Take 500 mg by mouth every 6 (six) hours as needed. For pain         . EXENATIDE 10 MCG/0.04ML [place] SOLN   Subcutaneous   Inject 10 mcg into the skin 2 (two) times daily with a meal.         . METFORMIN HCL 500 MG PO TABS   Oral   Take 500 mg by mouth 2 (two) times daily with a meal.           BP 110/78  Pulse 95  Temp 98.1 F (36.7 C) (Oral)  Resp 18  SpO2 92%  LMP  09/06/2012  Physical Exam  Nursing note and vitals reviewed. Constitutional: She is oriented to person, place, and time. She appears well-developed and well-nourished. No distress.  HENT:  Head: Normocephalic and atraumatic.  Eyes:       Normal appearance  Neck: Normal range of motion.  Pulmonary/Chest: Effort normal.  Musculoskeletal: Normal range of motion.       Right middle finger, mildly, uniformly edematous.  No skin changes or signs of trauma.  Pain w/ light palpation diffusely, but worst on palmar surface of finger. Pt holding in slight flexion and pain w/ minimal active or passive ROM.  Brisk cap refill and distal sensation intact.   Neurological: She is alert and oriented to person, place, and time.  Psychiatric: She has a normal mood and affect. Her behavior is normal.    ED Course  Procedures (including critical care time)  Labs Reviewed  BASIC METABOLIC PANEL - Abnormal; Notable for the following:    Glucose, Bld 121 (*)     GFR calc  non Af Amer 87 (*)     All other components within normal limits  CBC WITH DIFFERENTIAL  SEDIMENTATION RATE   Dg Hand Complete Right  09/23/2012  *RADIOLOGY REPORT*  Clinical Data: Right hand pain and swelling.  No known injury.  RIGHT HAND - COMPLETE 3+ VIEW  Comparison:  None.  Findings:  There is no evidence of fracture or dislocation.  There is no evidence of arthropathy or other focal bone abnormality. Soft tissues are unremarkable.  IMPRESSION: Negative.   Original Report Authenticated By: Myles Rosenthal, M.D.      1. Pain in finger of right hand       MDM  41yo F presents w/ non-traumatic R middle finger pain.  S/sx concerning for flexor tenosynovitis.  Consulted Dr. Melvyn Novas who will see her this morning.  Ortho tech buddy-taped fingers.  Pt d/c'd home w/ clindamycin and percocet.       Otilio Miu, PA-C 09/24/12 [phone]

## 2012-09-24 LAB — CBC WITH DIFFERENTIAL/PLATELET
Basophils Absolute: 0 10*3/uL (ref 0.0–0.1)
Basophils Relative: 0 % (ref 0–1)
Eosinophils Absolute: 0.3 10*3/uL (ref 0.0–0.7)
Eosinophils Relative: 4 % (ref 0–5)
HCT: 39.1 % (ref 36.0–46.0)
Lymphocytes Relative: 42 % (ref 12–46)
MCH: 28.8 pg (ref 26.0–34.0)
MCHC: 33.8 g/dL (ref 30.0–36.0)
MCV: 85.2 fL (ref 78.0–100.0)
Monocytes Absolute: 0.6 10*3/uL (ref 0.1–1.0)
Platelets: 288 10*3/uL (ref 150–400)
RDW: 12.5 % (ref 11.5–15.5)
WBC: 5.9 10*3/uL (ref 4.0–10.5)

## 2012-09-24 LAB — BASIC METABOLIC PANEL
Calcium: 9.7 mg/dL (ref 8.4–10.5)
Creatinine, Ser: 0.83 mg/dL (ref 0.50–1.10)
GFR calc Af Amer: >90 mL/min (ref >90)
GFR calc non Af Amer: 87 mL/min — ABNORMAL LOW (ref >90)
Sodium: 141 mEq/L (ref 135–145)

## 2012-09-24 LAB — SEDIMENTATION RATE: Sed Rate: 7 mm/hr (ref 0–22)

## 2012-09-24 MED ORDER — OXYCODONE-ACETAMINOPHEN 5-325 MG PO TABS: 1.0000 | ORAL_TABLET | ORAL | Status: DC | PRN

## 2012-09-24 MED ORDER — CLINDAMYCIN HCL 150 MG PO CAPS: 300.0000 mg | ORAL_CAPSULE | Freq: Three times a day (TID) | ORAL | Status: DC

## 2012-09-24 NOTE — Progress Notes (Signed)
Orthopedic Tech Progress Note Patient Details:  Adriana Preston [birth year] [id number]  Ortho Devices Type of Ortho Device: Buddy tape   Haskell Flirt 09/24/2012, 2:10 AM

## 2012-09-24 NOTE — ED Provider Notes (Signed)
Medical screening examination/treatment/procedure(s) were conducted as a shared visit with non-physician practitioner(s) and myself.  I personally evaluated the patient during the encounter.  Pt is diabetic, has non traumatic and noninfectious appearing tender middle finger over flexor tendon.  No broken skin, no erythema, warmth.  To f/u closely with hand surgery.  Olivia Mackie, MD 09/24/12 [phone]

## 2013-04-14 ENCOUNTER — Emergency Department (HOSPITAL_COMMUNITY)
Admission: EM | Admit: 2013-04-14 | Discharge: 2013-04-15 | Disposition: A | Discharge status: Home / Self Care | Payer: Medicaid Other | Attending: Emergency Medicine | Admitting: Emergency Medicine

## 2013-04-14 ENCOUNTER — Encounter (HOSPITAL_COMMUNITY): Payer: Self-pay | Admitting: Emergency Medicine

## 2013-04-14 DIAGNOSIS — R0981 Nasal congestion: Secondary | ICD-10-CM

## 2013-04-14 DIAGNOSIS — Z880 Allergy status to penicillin: Secondary | ICD-10-CM | POA: Insufficient documentation

## 2013-04-14 DIAGNOSIS — Z8679 Personal history of other diseases of the circulatory system: Secondary | ICD-10-CM | POA: Insufficient documentation

## 2013-04-14 DIAGNOSIS — J3489 Other specified disorders of nose and nasal sinuses: Secondary | ICD-10-CM | POA: Insufficient documentation

## 2013-04-14 DIAGNOSIS — E119 Type 2 diabetes mellitus without complications: Secondary | ICD-10-CM | POA: Insufficient documentation

## 2013-04-14 DIAGNOSIS — Z87891 Personal history of nicotine dependence: Secondary | ICD-10-CM | POA: Insufficient documentation

## 2013-04-14 DIAGNOSIS — R040 Epistaxis: Secondary | ICD-10-CM | POA: Insufficient documentation

## 2013-04-14 DIAGNOSIS — R0982 Postnasal drip: Secondary | ICD-10-CM | POA: Insufficient documentation

## 2013-04-14 NOTE — ED Provider Notes (Signed)
History    CSN: [id number] Arrival date & time 04/14/13  2312  First MD Initiated Contact with Patient 04/14/13 2358     Chief Complaint  Patient presents with  . Epistaxis   (Consider location/radiation/quality/duration/timing/severity/associated sxs/prior Treatment) HPI Comments: R sided epistaxis x 2 secondary to recent sinus congestion, rhinorrhea, and PND. Epistaxis relieved with ice packs and application of pressure to b/l nares. Patient has taken zyrtec and PO decongestants without relief of sinus symptoms. No fevers, vision changes, ear pain or d/c, difficulty swallowing or breathing, or sore throat.  Patient is a 42 y.o. female presenting with nosebleeds. The history is provided by the patient. No language interpreter was used.  Epistaxis Location:  R nare Severity:  Mild Duration: x 2. Timing:  Sporadic Progression:  Unchanged Chronicity:  New Context: not anticoagulants, not aspirin use (15) and not bleeding disorder   Context comment:  Recent sinus congestion, PND, and rhinorrhea Relieved by:  Ice and applying pressure Worsened by:  Nothing tried Associated symptoms: congestion   Associated symptoms: no blood in oropharynx, no dizziness, no fever and no sore throat   Associated symptoms comment:  PND and sinus congestion Risk factors: no frequent nosebleeds    Past Medical History  Diagnosis Date  . Headache, classical migraine   . Diabetes mellitus without complication    Past Surgical History  Procedure Laterality Date  . Breast surgery    . Tubal ligation     Family History  Problem Relation Age of Onset  . Hypertension Mother   . Aneurysm Mother   . Coronary aneurysm Mother   . Hypotension Mother   . Hypertension Father   . Glaucoma Father   . Hypotension Father    History  Substance Use Topics  . Smoking status: Former Smoker    Quit date: 05/23/2011  . Smokeless tobacco: Former Neurosurgeon    Quit date: 05/23/2011  . Alcohol Use: Yes     Comment:  less than once a quarter, if so, mixed drink   OB History   Grav Para Term Preterm Abortions TAB SAB Ect Mult Living                 Review of Systems  Constitutional: Negative for fever.  HENT: Positive for nosebleeds, congestion, postnasal drip and sinus pressure. Negative for ear pain, sore throat, drooling, trouble swallowing, neck pain, neck stiffness and ear discharge.   Respiratory: Negative for shortness of breath.   Neurological: Negative for dizziness, syncope, weakness and light-headedness.  All other systems reviewed and are negative.    Allergies  Banana; Onion; Penicillins; and Tomato  Home Medications   Current Outpatient Rx  Name  Route  Sig  Dispense  Refill  . exenatide (BYETTA) 10 MCG/0.04ML SOLN   Subcutaneous   Inject 10 mcg into the skin 2 (two) times daily with a meal.         . metFORMIN (GLUCOPHAGE) 500 MG tablet   Oral   Take 500 mg by mouth 2 (two) times daily with a meal.         . OVER THE COUNTER MEDICATION   Oral   Take 1 tablet by mouth daily as needed (sinus medicaiton).         . cetirizine-pseudoephedrine (ZYRTEC-D) 5-120 MG per tablet   Oral   Take 1 tablet by mouth 2 (two) times daily.   15 tablet   0   . oxymetazoline (AFRIN NASAL SPRAY) 0.05 % nasal spray  Nasal   Place 2 sprays into the nose 2 (two) times daily. Use for no more than 3 days   30 mL   0    BP 128/71  Pulse 106  Temp(Src) 98.6 F (37 C) (Oral)  Resp 22  SpO2 97%  LMP 03/04/2013 Physical Exam  Nursing note and vitals reviewed. Constitutional: She is oriented to person, place, and time. She appears well-developed and well-nourished. No distress.  HENT:  Head: Normocephalic and atraumatic. No trismus in the jaw.  Right Ear: Tympanic membrane, external ear and ear canal normal. No mastoid tenderness.  Left Ear: Tympanic membrane, external ear and ear canal normal. No mastoid tenderness.  Nose: Mucosal edema present. No rhinorrhea, nose lacerations,  sinus tenderness or nasal deformity. No epistaxis (no active bleeding).  No foreign bodies. Right sinus exhibits maxillary sinus tenderness and frontal sinus tenderness. Left sinus exhibits maxillary sinus tenderness and frontal sinus tenderness.  Mouth/Throat: Uvula is midline, oropharynx is clear and moist and mucous membranes are normal. No oral lesions. No edematous. No oropharyngeal exudate, posterior oropharyngeal edema or posterior oropharyngeal erythema.  Ruptured capillary in R anterior nasal septum; no active bleeding.  Neck: Normal range of motion. Neck supple.  Cardiovascular: Normal rate, regular rhythm, normal heart sounds and intact distal pulses.   Not tachycardic as noted in triage  Pulmonary/Chest: Effort normal and breath sounds normal. No stridor. No respiratory distress. She has no wheezes. She has no rales.  Musculoskeletal: Normal range of motion.  Lymphadenopathy:    She has no cervical adenopathy.  Neurological: She is alert and oriented to person, place, and time.  Skin: Skin is warm and dry. No rash noted. She is not diaphoretic. No erythema. No pallor.  Psychiatric: She has a normal mood and affect. Her behavior is normal.    ED Course  Procedures (including critical care time) Labs Reviewed - No data to display No results found.  1. Anterior epistaxis   2. Nasal sinus congestion     MDM  Uncomplicated epistaxis - Physical exam findings significant for ruptured capillary and right anterior nasal septum without active bleeding. Patient hemodynamically stable and in no acute distress; airway patent and there is no tachycardia or tachypnea. Aspirin administered in the ED for nasal congestion. Patient appropriate for discharge with primary care followup. Patient given prescription for Zyrtec-D and afrin for symptoms. Indications for ED return discussed with the patient who verbalizes comfort and understanding of plan.     Antony Madura, PA-C 04/15/13 [phone]

## 2013-04-14 NOTE — ED Notes (Signed)
PT. REPORTS EPISTAXIS X2 TODAY , NO BLEEDING AT TRIAGE .

## 2013-04-15 MED ORDER — OXYMETAZOLINE HCL 0.05 % NA SOLN
1.0000 | Freq: Once | NASAL | Status: AC
  Administered 2013-04-15: 1 via NASAL
  Filled 2013-04-15: qty 15

## 2013-04-15 MED ORDER — OXYMETAZOLINE HCL 0.05 % NA SOLN: 2.0000 | Freq: Two times a day (BID) | NASAL | Status: AC

## 2013-04-15 MED ORDER — CETIRIZINE-PSEUDOEPHEDRINE ER 5-120 MG PO TB12: 1.0000 | ORAL_TABLET | Freq: Two times a day (BID) | ORAL | Status: AC

## 2013-05-02 NOTE — ED Provider Notes (Signed)
Medical screening examination/treatment/procedure(s) were performed by non-physician practitioner and as supervising physician I was immediately available for consultation/collaboration.   Brandt Loosen, MD 05/02/13 [phone]

## 2014-01-21 ENCOUNTER — Other Ambulatory Visit: Payer: Self-pay

## 2014-01-21 DIAGNOSIS — Z9889 Other specified postprocedural states: Secondary | ICD-10-CM

## 2014-01-21 DIAGNOSIS — Z1231 Encounter for screening mammogram for malignant neoplasm of breast: Secondary | ICD-10-CM

## 2014-01-27 ENCOUNTER — Ambulatory Visit
Admission: RE | Admit: 2014-01-27 | Discharge: 2014-01-27 | Disposition: A | Discharge status: Home / Self Care | Payer: Medicaid Other | Source: Ambulatory Visit

## 2014-01-27 DIAGNOSIS — Z9889 Other specified postprocedural states: Secondary | ICD-10-CM

## 2014-01-27 DIAGNOSIS — Z1231 Encounter for screening mammogram for malignant neoplasm of breast: Secondary | ICD-10-CM

## 2017-08-10 ENCOUNTER — Emergency Department (HOSPITAL_BASED_OUTPATIENT_CLINIC_OR_DEPARTMENT_OTHER): Payer: BC Managed Care – PPO

## 2017-08-10 ENCOUNTER — Encounter (HOSPITAL_BASED_OUTPATIENT_CLINIC_OR_DEPARTMENT_OTHER): Payer: Self-pay | Admitting: Emergency Medicine

## 2017-08-10 ENCOUNTER — Emergency Department (HOSPITAL_BASED_OUTPATIENT_CLINIC_OR_DEPARTMENT_OTHER)
Admission: EM | Admit: 2017-08-10 | Discharge: 2017-08-10 | Disposition: A | Discharge status: Home / Self Care | Payer: BC Managed Care – PPO | Attending: Emergency Medicine | Admitting: Emergency Medicine

## 2017-08-10 DIAGNOSIS — Z79899 Other long term (current) drug therapy: Secondary | ICD-10-CM | POA: Diagnosis not present

## 2017-08-10 DIAGNOSIS — R0981 Nasal congestion: Secondary | ICD-10-CM | POA: Diagnosis not present

## 2017-08-10 DIAGNOSIS — R51 Headache: Secondary | ICD-10-CM | POA: Insufficient documentation

## 2017-08-10 DIAGNOSIS — E119 Type 2 diabetes mellitus without complications: Secondary | ICD-10-CM | POA: Diagnosis not present

## 2017-08-10 DIAGNOSIS — Z87891 Personal history of nicotine dependence: Secondary | ICD-10-CM | POA: Diagnosis not present

## 2017-08-10 DIAGNOSIS — R0602 Shortness of breath: Secondary | ICD-10-CM | POA: Diagnosis not present

## 2017-08-10 DIAGNOSIS — J029 Acute pharyngitis, unspecified: Secondary | ICD-10-CM | POA: Diagnosis present

## 2017-08-10 DIAGNOSIS — R590 Localized enlarged lymph nodes: Secondary | ICD-10-CM | POA: Insufficient documentation

## 2017-08-10 DIAGNOSIS — R6889 Other general symptoms and signs: Secondary | ICD-10-CM | POA: Diagnosis not present

## 2017-08-10 DIAGNOSIS — Z7984 Long term (current) use of oral hypoglycemic drugs: Secondary | ICD-10-CM | POA: Insufficient documentation

## 2017-08-10 DIAGNOSIS — R509 Fever, unspecified: Secondary | ICD-10-CM | POA: Diagnosis not present

## 2017-08-10 LAB — RAPID STREP SCREEN (MED CTR MEBANE ONLY): Streptococcus, Group A Screen (Direct): NEGATIVE

## 2017-08-10 LAB — CBG MONITORING, ED: GLUCOSE-CAPILLARY: 300 mg/dL — AB (ref 65–99)

## 2017-08-10 MED ORDER — IBUPROFEN 600 MG PO TABS: 600.0000 mg | ORAL_TABLET | Freq: Four times a day (QID) | ORAL | 0 refills | Status: AC | PRN

## 2017-08-10 MED ORDER — ACETAMINOPHEN ER 650 MG PO TBCR: 650.0000 mg | EXTENDED_RELEASE_TABLET | Freq: Three times a day (TID) | ORAL | 0 refills | Status: AC | PRN

## 2017-08-10 MED ORDER — ACETAMINOPHEN 500 MG PO TABS
1000.0000 mg | ORAL_TABLET | Freq: Once | ORAL | Status: AC
  Administered 2017-08-10: 1000 mg via ORAL
  Filled 2017-08-10: qty 2

## 2017-08-10 NOTE — ED Notes (Signed)
Pt given ice water and crackers, per MD. Told pt to notify RN if she vomits.

## 2017-08-10 NOTE — ED Triage Notes (Signed)
Pt presents with complaints of flu like symptoms, and abdominal pain and thinks her cbg is high. PT last took motrin at 5pm.

## 2017-08-10 NOTE — ED Notes (Signed)
Patient transported to X-ray 

## 2017-08-10 NOTE — ED Notes (Signed)
Alert, NAD, calm, interactive, resps e/u, speaking in clear complete sentences, no dyspnea noted, skin W&D, VSS, "feel the same", c/o sore throat, earache, abd pain, sob. Family at [facility].

## 2017-08-10 NOTE — ED Notes (Signed)
Family at bedside. 

## 2017-08-10 NOTE — Discharge Instructions (Signed)
We think what you have is a viral syndrome - the treatment for which is symptomatic relief only, and your body will fight the infection off in a few days. °We are prescribing you some meds for pain and fevers. °See your primary care doctor in 1 week if the symptoms dont improve. ° °Please return to the ER if your symptoms worsen; you have increased pain, fevers, chills, inability to keep any medications down, confusion. Otherwise see the outpatient doctor as requested. ° °

## 2017-08-10 NOTE — ED Provider Notes (Addendum)
[facility] EMERGENCY DEPARTMENT Provider Note   CSN: [id number] Arrival date & time: 08/10/17  0114     History   Chief Complaint Chief Complaint  Patient presents with  . multiple complaints    HPI Adriana Preston is a 46 y.o. female.  HPI Patient comes in with chief complaint of headache, sore throat, congestion, runny nose, shortness of breath, myalgias, chills, elevated blood sugar. Patient has history of diabetes. She reports that she had some URI like symptoms which resolved a few days back, however she started developing aforementioned symptoms yesterday.  Patient took Motrin with no significant relief.  She was noted to be having a fever, patient was unaware of any fevers at home.  Patient feels that there is difficulty with swallowing.  Patient denies any obstructive shortness of breath in the upper airway region, or stridors.  Past Medical History:  Diagnosis Date  . Diabetes mellitus without complication ([place])   . Headache, classical migraine     Patient Active Problem List   Diagnosis Date Noted  . Cellulitis of lower leg 10/14/2011  . Headache(784.0) 10/14/2011  . Hyperglycemia 10/14/2011  . ALLERGIC RHINITIS 04/06/2008    Past Surgical History:  Procedure Laterality Date  . BREAST SURGERY    . TUBAL LIGATION      OB History    No data available       Home Medications    Prior to Admission medications   Medication Sig Start Date End Date Taking? Authorizing Provider  acetaminophen (TYLENOL 8 HOUR) 650 MG CR tablet Take 1 tablet (650 mg total) by mouth every 8 (eight) hours as needed for pain. 08/10/17   Varney Biles, MD  cetirizine-pseudoephedrine (ZYRTEC-D) 5-120 MG per tablet Take 1 tablet by mouth 2 (two) times daily. 04/15/13   Antonietta Breach, PA-C  exenatide (BYETTA) 10 MCG/0.04ML SOLN Inject 10 mcg into the skin 2 (two) times daily with a meal.    [provider]  ibuprofen (ADVIL,MOTRIN) 600 MG tablet Take 1 tablet  (600 mg total) by mouth every 6 (six) hours as needed. 08/10/17   Varney Biles, MD  metFORMIN (GLUCOPHAGE) 500 MG tablet Take 500 mg by mouth 2 (two) times daily with a meal.    [provider]  OVER THE COUNTER MEDICATION Take 1 tablet by mouth daily as needed (sinus medicaiton).    [provider]  oxymetazoline (AFRIN NASAL SPRAY) 0.05 % nasal spray Place 2 sprays into the nose 2 (two) times daily. Use for no more than 3 days 04/15/13   Antonietta Breach, PA-C    Family History Family History  Problem Relation Age of Onset  . Hypertension Mother   . Aneurysm Mother   . Coronary aneurysm Mother   . Hypotension Mother   . Hypertension Father   . Glaucoma Father   . Hypotension Father     Social History Social History  Substance Use Topics  . Smoking status: Former Smoker    Quit date: 05/23/2011  . Smokeless tobacco: Former Systems developer    Quit date: 05/23/2011  . Alcohol use Yes     Comment: less than once a quarter, if so, mixed drink     Allergies   Banana; Onion; Penicillins; and Tomato   Review of Systems Review of Systems  Constitutional: Positive for activity change.  HENT: Positive for postnasal drip, rhinorrhea, sinus pressure, sore throat, trouble swallowing and voice change.   Respiratory: Negative for cough.   Cardiovascular: Positive for chest pain.  Allergic/Immunologic: Negative for immunocompromised state.  Neurological: Positive for headaches.  All other systems reviewed and are negative.    Physical Exam Updated Vital Signs BP 115/78   Pulse 99   Temp 98.9 F (37.2 C) (Oral)   Resp 20   LMP 06/21/2017   SpO2 94%   Physical Exam  Constitutional: She is oriented to person, place, and time. She appears well-developed and well-nourished. No distress.  HENT:  Head: Atraumatic.  Eyes: Conjunctivae are normal.  Neck: Neck supple.  No meningismus. Pt has no trismus, stridor or crepitus over the neck. Pt has tonsillar enlargement but no  exudates. Pharynx is erythematous.   Cardiovascular: Normal rate and regular rhythm.   Pulmonary/Chest: Effort normal and breath sounds normal. No stridor. No respiratory distress. She has no wheezes.  Abdominal: Soft. There is no tenderness.  Musculoskeletal: She exhibits no edema.  Lymphadenopathy:    She has cervical adenopathy.  Neurological: She is alert and oriented to person, place, and time.  Skin: Skin is warm and dry.  Nursing note and vitals reviewed.    ED Treatments / Results  Labs (all labs ordered are listed, but only abnormal results are displayed) Labs Reviewed  CBG MONITORING, ED - Abnormal; Notable for the following:       Result Value   Glucose-Capillary 300 (*)    All other components within normal limits  RAPID STREP SCREEN (NOT AT [facility])  CULTURE, GROUP A STREP [facility])    EKG  EKG Interpretation None       Radiology Dg Neck Soft Tissue  Result Date: 08/10/2017 CLINICAL DATA:  Sore throat for 1 day. EXAM: NECK SOFT TISSUES - 1+ VIEW COMPARISON:  CT head 10/14/2011 FINDINGS: There is no evidence of retropharyngeal soft tissue swelling or epiglottic enlargement. The cervical airway is unremarkable and no radio-opaque foreign body identified. IMPRESSION: Negative. Electronically Signed   By: Lucienne Capers M.D.   On: 08/10/2017 03:41    Procedures Procedures (including critical care time)  Medications Ordered in ED Medications  acetaminophen (TYLENOL) tablet 1,000 mg (1,000 mg Oral Given 08/10/17 0222)     Initial Impression / Assessment and Plan / ED Course  I have reviewed the triage vital signs and the nursing notes.  Pertinent labs & imaging results that were available during my care of the patient were reviewed by me and considered in my medical decision making (see chart for details).     DDX includes: Viral syndrome Influenza Pharyngitis Sinusitis Mononucleosis Electrolyte abnormality  Patient comes in with what appears to be a  flulike illness.  Patient has multiple positive constitutional.  She has sore throat with tonsillar enlargement minus the cough.  We ordered a rapid strep which is negative.  And reports that she feels she is having some dysphagia -there was tonsillar enlargement on my exam but no evidence of any obstruction.  Soft tissue x-ray of the neck is negative. Patient was given medicine for fever and the fever has responded along with the heart rate.  P.o. challenge passed. Strict ER return precautions have been discussed, and patient is agreeing with the plan and is comfortable with the workup done and the recommendations from the ER.   Final Clinical Impressions(s) / ED Diagnoses   Final diagnoses:  Flu-like symptoms    New Prescriptions New Prescriptions   ACETAMINOPHEN (TYLENOL 8 HOUR) 650 MG CR TABLET    Take 1 tablet (650 mg total) by mouth every 8 (eight) hours as needed for pain.  IBUPROFEN (ADVIL,MOTRIN) 600 MG TABLET    Take 1 tablet (600 mg total) by mouth every 6 (six) hours as needed.     Varney Biles, MD 08/10/17 4332    Varney Biles, MD 08/10/17 9518

## 2017-08-12 LAB — CULTURE, GROUP A STREP (THRC)

## 2017-10-01 ENCOUNTER — Other Ambulatory Visit: Payer: Self-pay | Admitting: Nurse Practitioner

## 2017-10-01 DIAGNOSIS — Z1231 Encounter for screening mammogram for malignant neoplasm of breast: Secondary | ICD-10-CM

## 2017-10-02 ENCOUNTER — Ambulatory Visit: Payer: BC Managed Care – PPO

## 2017-11-05 ENCOUNTER — Ambulatory Visit
Admission: RE | Admit: 2017-11-05 | Discharge: 2017-11-05 | Disposition: A | Discharge status: Home / Self Care | Payer: BC Managed Care – PPO | Source: Ambulatory Visit | Attending: Nurse Practitioner | Admitting: Nurse Practitioner

## 2017-11-05 DIAGNOSIS — Z1231 Encounter for screening mammogram for malignant neoplasm of breast: Secondary | ICD-10-CM

## 2019-07-13 ENCOUNTER — Other Ambulatory Visit: Payer: Self-pay | Admitting: Nurse Practitioner

## 2019-07-13 DIAGNOSIS — Z1231 Encounter for screening mammogram for malignant neoplasm of breast: Secondary | ICD-10-CM

## 2019-08-30 ENCOUNTER — Ambulatory Visit
Admission: RE | Admit: 2019-08-30 | Discharge: 2019-08-30 | Disposition: A | Discharge status: Home / Self Care | Payer: BC Managed Care – PPO | Source: Ambulatory Visit | Attending: Nurse Practitioner | Admitting: Nurse Practitioner

## 2019-08-30 ENCOUNTER — Other Ambulatory Visit: Payer: Self-pay

## 2019-08-30 DIAGNOSIS — Z1231 Encounter for screening mammogram for malignant neoplasm of breast: Secondary | ICD-10-CM

## 2020-02-04 IMAGING — MG DIGITAL SCREENING BILAT W/ CAD
5 series · 5 of 5 positions shown · non-contrast
Comparison: Previous exam(s).

ACR Breast Density Category a: The breast tissue is almost entirely
fatty.

CLINICAL DATA: Screening.

EXAM:
DIGITAL SCREENING BILATERAL MAMMOGRAM WITH CAD

[R MLO (1 of 2)]
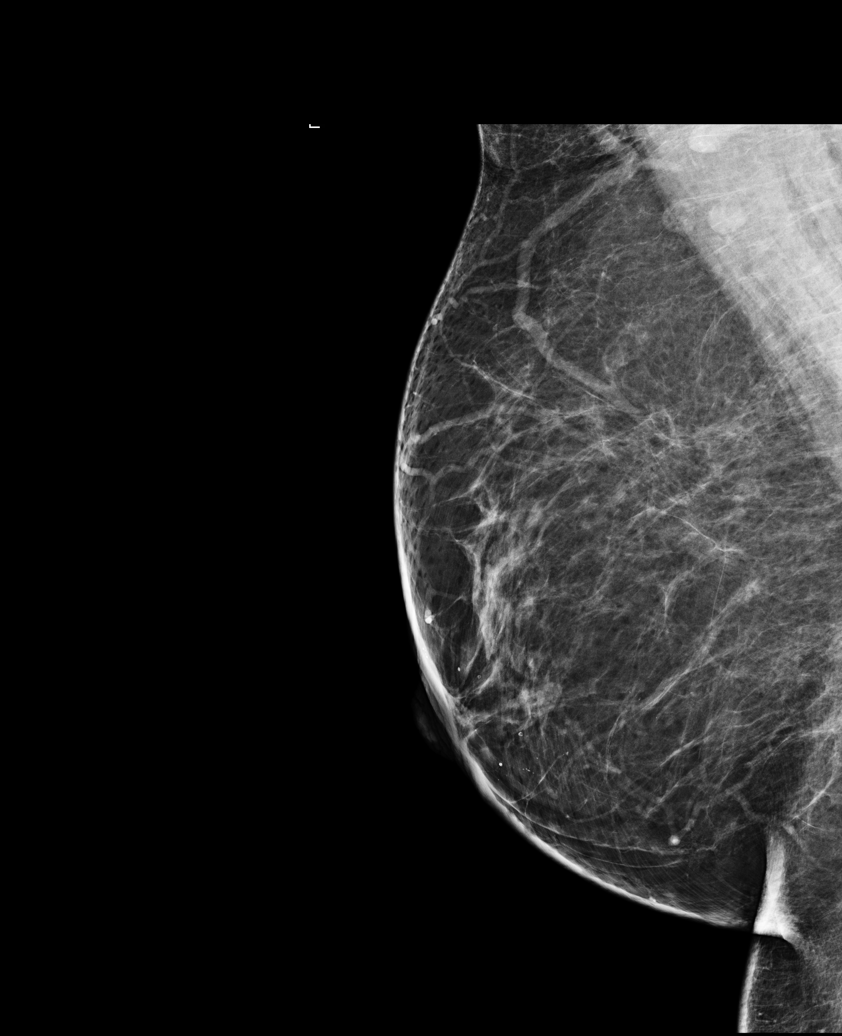

[L MLO]
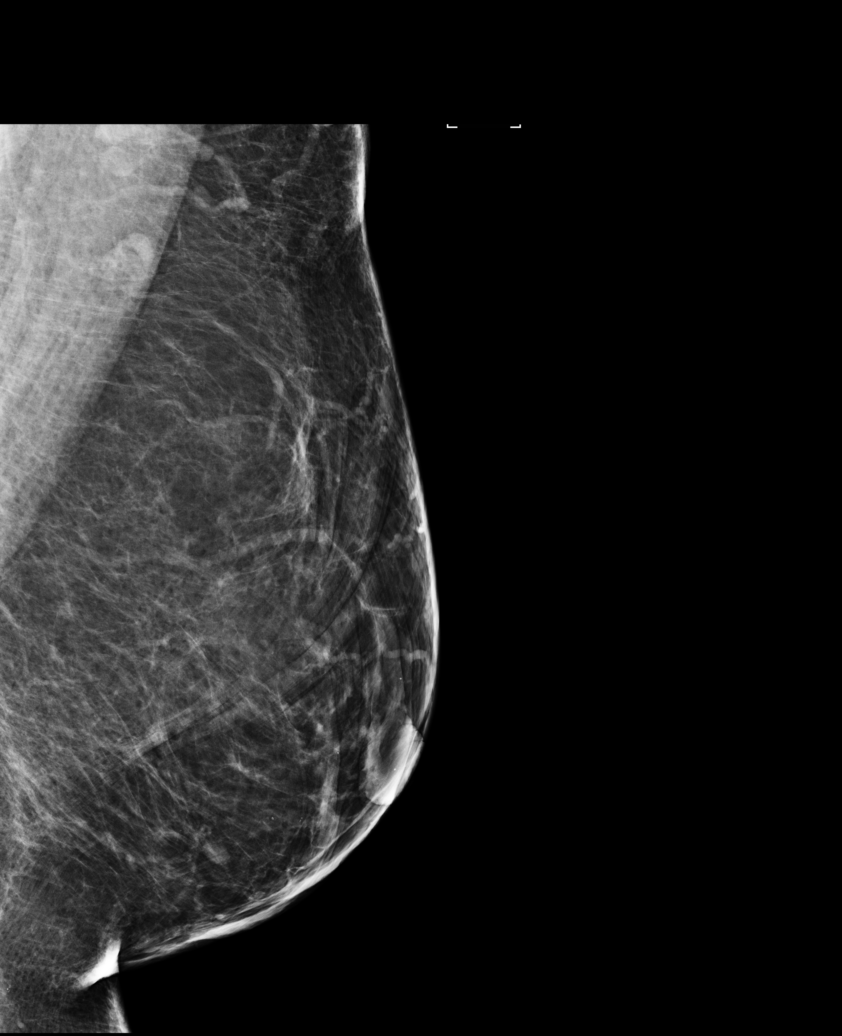

[L CC]
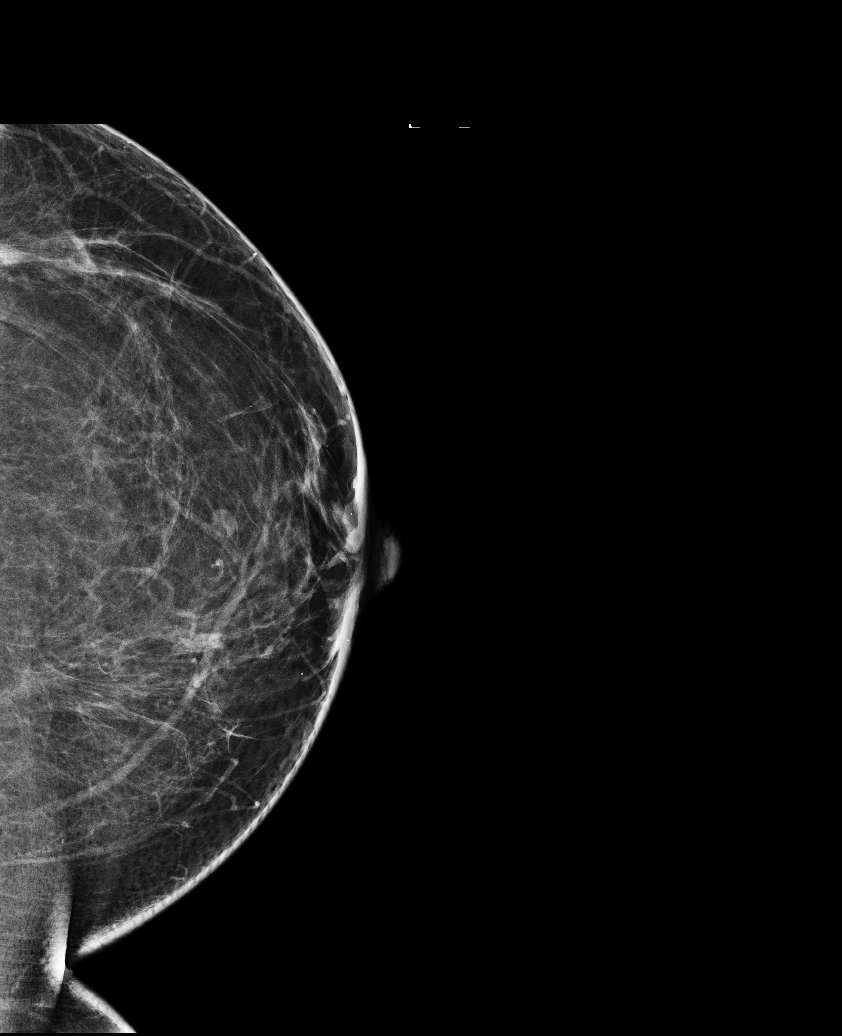

[R CC]
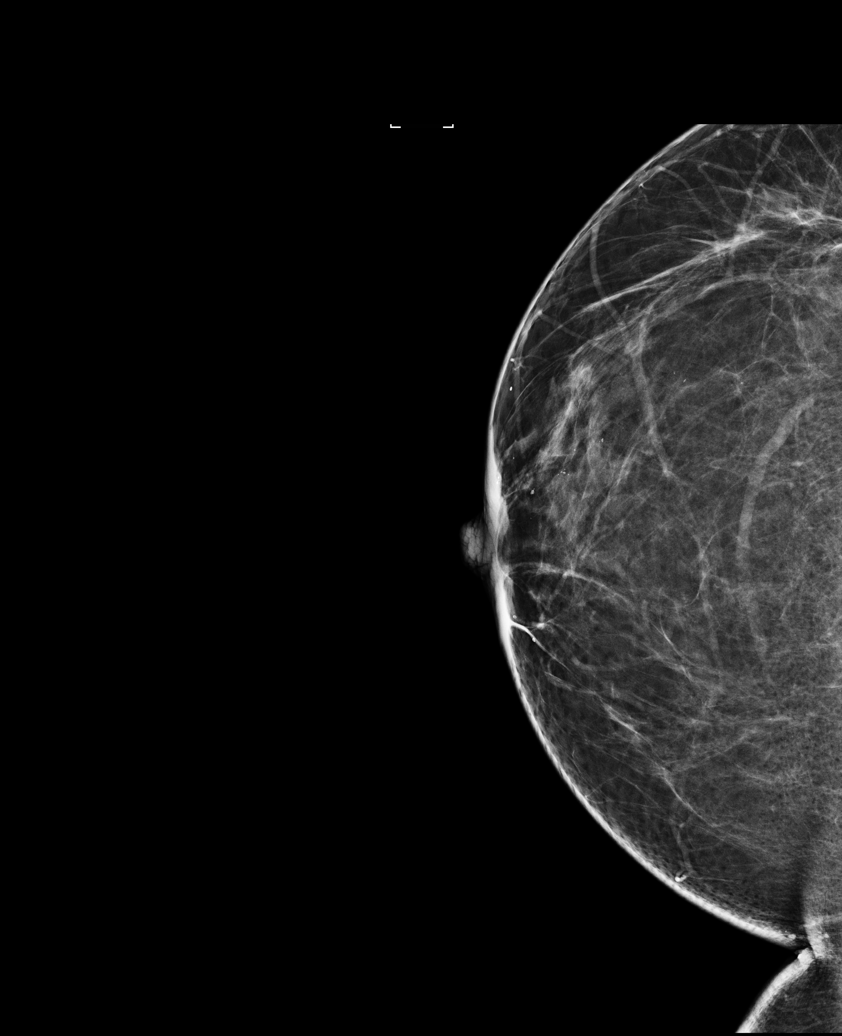

[R MLO (2 of 2)]
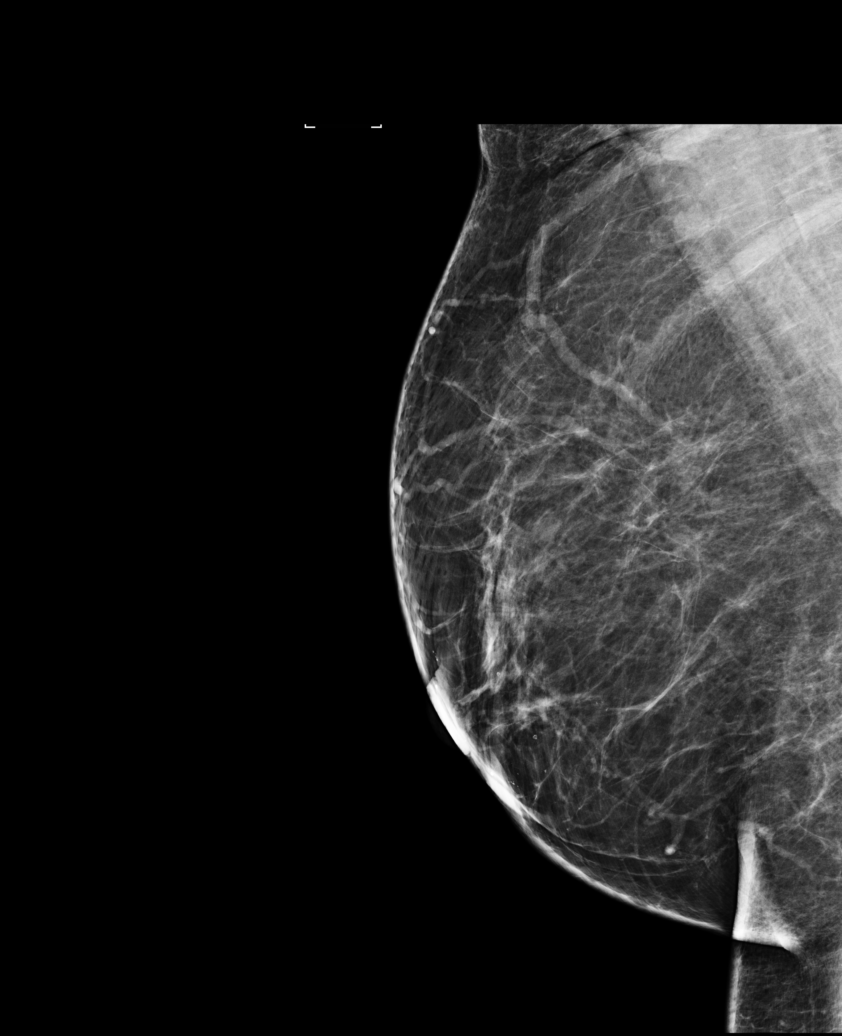

[5 of 5 positions shown; findings below may reference images not displayed]

FINDINGS: There are no findings suspicious for malignancy. Images were
processed with CAD.
IMPRESSION: No mammographic evidence of malignancy. A result letter of this
screening mammogram will be mailed directly to the patient.

RECOMMENDATION:
Screening mammogram in one year. (Code:[id number])

BI-RADS CATEGORY  1: Negative.

## 2020-11-29 ENCOUNTER — Other Ambulatory Visit: Payer: Self-pay | Admitting: Nurse Practitioner

## 2020-11-29 DIAGNOSIS — Z1231 Encounter for screening mammogram for malignant neoplasm of breast: Secondary | ICD-10-CM

## 2021-01-18 ENCOUNTER — Ambulatory Visit: Payer: BC Managed Care – PPO

## 2021-03-09 ENCOUNTER — Other Ambulatory Visit: Payer: Self-pay

## 2021-03-09 ENCOUNTER — Ambulatory Visit
Admission: RE | Admit: 2021-03-09 | Discharge: 2021-03-09 | Disposition: A | Discharge status: Home / Self Care | Payer: Medicaid Other | Source: Ambulatory Visit | Attending: Nurse Practitioner | Admitting: Nurse Practitioner

## 2021-03-09 DIAGNOSIS — Z1231 Encounter for screening mammogram for malignant neoplasm of breast: Secondary | ICD-10-CM

## 2022-01-24 ENCOUNTER — Encounter: Payer: Self-pay | Admitting: Gastroenterology

## 2022-03-19 ENCOUNTER — Ambulatory Visit (AMBULATORY_SURGERY_CENTER): Payer: Self-pay | Admitting: *Deleted

## 2022-03-19 VITALS — Ht 67.0 in | Wt 215.0 lb

## 2022-03-19 DIAGNOSIS — Z1211 Encounter for screening for malignant neoplasm of colon: Secondary | ICD-10-CM

## 2022-03-19 MED ORDER — PEG 3350-KCL-NA BICARB-NACL 420 G PO SOLR: 4000.0000 mL | Freq: Once | ORAL | 0 refills | Status: AC

## 2022-03-19 NOTE — Progress Notes (Signed)

## 2022-04-09 ENCOUNTER — Encounter: Payer: Medicaid Other | Admitting: Gastroenterology

## 2022-04-12 ENCOUNTER — Encounter: Payer: Self-pay | Admitting: Gastroenterology

## 2022-04-16 ENCOUNTER — Encounter: Payer: Medicaid Other | Admitting: Gastroenterology

## 2022-04-22 ENCOUNTER — Encounter: Payer: Self-pay | Admitting: Gastroenterology

## 2022-04-22 ENCOUNTER — Ambulatory Visit (AMBULATORY_SURGERY_CENTER): Payer: Self-pay | Admitting: Gastroenterology

## 2022-04-22 VITALS — BP 107/74 | HR 74 | Temp 98.6°F | Resp 19 | Ht 67.0 in | Wt 215.0 lb

## 2022-04-22 DIAGNOSIS — D124 Benign neoplasm of descending colon: Secondary | ICD-10-CM

## 2022-04-22 DIAGNOSIS — D125 Benign neoplasm of sigmoid colon: Secondary | ICD-10-CM

## 2022-04-22 DIAGNOSIS — Z1211 Encounter for screening for malignant neoplasm of colon: Secondary | ICD-10-CM

## 2022-04-22 DIAGNOSIS — K635 Polyp of colon: Secondary | ICD-10-CM

## 2022-04-22 MED ORDER — SODIUM CHLORIDE 0.9 % IV SOLN: 500.0000 mL | INTRAVENOUS | Status: DC

## 2022-04-22 NOTE — Patient Instructions (Signed)
Handouts given for polyps and diverticulosis.  YOU HAD AN ENDOSCOPIC PROCEDURE TODAY AT [facility]:   Refer to the procedure report that was given to you for any specific questions about what was found during the examination.  If the procedure report does not answer your questions, please call your gastroenterologist to clarify.  If you requested that your care partner not be given the details of your procedure findings, then the procedure report has been included in a sealed envelope for you to review at your convenience later.  YOU SHOULD EXPECT: Some feelings of bloating in the abdomen. Passage of more gas than usual.  Walking can help get rid of the air that was put into your GI tract during the procedure and reduce the bloating. If you had a lower endoscopy (such as a colonoscopy or flexible sigmoidoscopy) you may notice spotting of blood in your stool or on the toilet paper. If you underwent a bowel prep for your procedure, you may not have a normal bowel movement for a few days.  Please Note:  You might notice some irritation and congestion in your nose or some drainage.  This is from the oxygen used during your procedure.  There is no need for concern and it should clear up in a day or so.  SYMPTOMS TO REPORT IMMEDIATELY:  Following lower endoscopy (colonoscopy):  Excessive amounts of blood in the stool  Significant tenderness or worsening of abdominal pains  Swelling of the abdomen that is new, acute  Fever of 100F or higher  For urgent or emergent issues, a gastroenterologist can be reached at any hour by calling [phone]. Do not use MyChart messaging for urgent concerns.    DIET:  We do recommend a small meal at first, but then you may proceed to your regular diet.  Drink plenty of fluids but you should avoid alcoholic beverages for 24 hours.  ACTIVITY:  You should plan to take it easy for the rest of today and you should NOT DRIVE or use heavy machinery  until tomorrow (because of the sedation medicines used during the test).    FOLLOW UP: Our staff will call the number listed on your records the next business day following your procedure.  We will call around 7:15- 8:00 am to check on you and address any questions or concerns that you may have regarding the information given to you following your procedure. If we do not reach you, we will leave a message.  If you develop any symptoms (ie: fever, flu-like symptoms, shortness of breath, cough etc.) before then, please call [phone].  If you test positive for Covid 19 in the 2 weeks post procedure, please call and report this information to [place].    If any biopsies were taken you will be contacted by phone or by letter within the next 1-3 weeks.  Please call us at [phone] if you have not heard about the biopsies in 3 weeks.    SIGNATURES/CONFIDENTIALITY: You and/or your care partner have signed paperwork which will be entered into your electronic medical record.  These signatures attest to the fact that that the information above on your After Visit Summary has been reviewed and is understood.  Full responsibility of the confidentiality of this discharge information lies with you and/or your care-partner.

## 2022-04-22 NOTE — Op Note (Signed)
[facility] Patient Name: Adriana Preston Procedure Date: 04/22/2022 2:36 PM MRN: [id number] Endoscopist: Milus Banister , MD Age: 51 Referring MD:  Date of Birth: 1971-08-05 Gender: Female Account #: [id number] Procedure:                Colonoscopy Indications:              Screening for colorectal malignant neoplasm Medicines:                Monitored Anesthesia Care Procedure:                Pre-Anesthesia Assessment:                           - Prior to the procedure, a History and Physical                            was performed, and patient medications and                            allergies were reviewed. The patient's tolerance of                            previous anesthesia was also reviewed. The risks                            and benefits of the procedure and the sedation                            options and risks were discussed with the patient.                            All questions were answered, and informed consent                            was obtained. Prior Anticoagulants: The patient has                            taken no previous anticoagulant or antiplatelet                            agents. ASA Grade Assessment: II - A patient with                            mild systemic disease. After reviewing the risks                            and benefits, the patient was deemed in                            satisfactory condition to undergo the procedure.                           After obtaining informed consent, the colonoscope  was passed under direct vision. Throughout the                            procedure, the patient's blood pressure, pulse, and                            oxygen saturations were monitored continuously. The                            Olympus Colonoscope [id number] was introduced through                            the anus and advanced to the the cecum, identified                            by  appendiceal orifice and ileocecal valve. The                            colonoscopy was performed without difficulty. The                            patient tolerated the procedure well. The quality                            of the bowel preparation was good. The ileocecal                            valve, appendiceal orifice, and rectum were                            photographed. Scope In: 2:38:07 PM Scope Out: 2:50:25 PM Scope Withdrawal Time: 0 hours 8 minutes 6 seconds  Total Procedure Duration: 0 hours 12 minutes 18 seconds  Findings:                 Two sessile polyps were found in the sigmoid colon                            and descending colon. The polyps were 2 to 3 mm in                            size. These polyps were removed with a cold snare.                            Resection and retrieval were complete.                           Multiple small and large-mouthed diverticula were                            found in the left colon.                           The exam was otherwise without abnormality on  direct and retroflexion views. Complications:            No immediate complications. Estimated blood loss:                            None. Estimated Blood Loss:     Estimated blood loss: none. Impression:               - Two 2 to 3 mm polyps in the sigmoid colon and in                            the descending colon, removed with a cold snare.                            Resected and retrieved.                           - Diverticulosis in the left colon.                           - The examination was otherwise normal on direct                            and retroflexion views. Recommendation:           - Patient has a contact number available for                            emergencies. The signs and symptoms of potential                            delayed complications were discussed with the                            patient. Return to  normal activities tomorrow.                            Written discharge instructions were provided to the                            patient.                           - Resume previous diet.                           - Continue present medications.                           - Await pathology results. Milus Banister, MD 04/22/2022 2:53:22 PM This report has been signed electronically.

## 2022-04-22 NOTE — Progress Notes (Signed)
Pt's states no medical or surgical changes since previsit or office visit. 

## 2022-04-22 NOTE — Progress Notes (Signed)
Report to pacu rn; vss. Care resumed by rn. 

## 2022-04-22 NOTE — Progress Notes (Signed)
Called to room to assist during endoscopic procedure.  Patient ID and intended procedure confirmed with present staff. Received instructions for my participation in the procedure from the performing physician.  

## 2022-04-22 NOTE — Progress Notes (Signed)
HPI: This is a woman at routine risk for CRC   ROS: complete GI ROS as described in HPI, all other review negative.  Constitutional:  No unintentional weight loss   Past Medical History:  Diagnosis Date   Allergy    SEASONAL   Diabetes mellitus without complication ([facility])    Headache, classical migraine     Past Surgical History:  Procedure Laterality Date   BREAST SURGERY     COLONOSCOPY     KNEE SURGERY Right    MENSCIUS   TUBAL LIGATION      Current Outpatient Medications  Medication Sig Dispense Refill   acetaminophen (TYLENOL 8 HOUR) 650 MG CR tablet Take 1 tablet (650 mg total) by mouth every 8 (eight) hours as needed for pain. 30 tablet 0   augmented betamethasone dipropionate (DIPROLENE-AF) 0.05 % ointment Apply topically.     Blood Glucose Monitoring Suppl (ACCU-CHEK GUIDE) w/Device KIT by Does not apply route.     Blood Glucose Monitoring Suppl (ACCU-CHEK GUIDE) w/Device KIT See admin instructions.     cetirizine-pseudoephedrine (ZYRTEC-D) 5-120 MG per tablet Take 1 tablet by mouth 2 (two) times daily. (Patient taking differently: Take 1 tablet by mouth daily.) 15 tablet 0   CINNAMON PO Take by mouth.     cyanocobalamin 1000 MCG tablet Take by mouth.     doxycycline (VIBRA-TABS) 100 MG tablet Take 100 mg by mouth 2 (two) times daily.     exenatide (BYETTA) 10 MCG/0.04ML SOLN Inject 10 mcg into the skin 2 (two) times daily with a meal.     ibuprofen (ADVIL,MOTRIN) 600 MG tablet Take 1 tablet (600 mg total) by mouth every 6 (six) hours as needed. 30 tablet 0   JARDIANCE 25 MG TABS tablet Take 25 mg by mouth daily.     metFORMIN (GLUCOPHAGE) 500 MG tablet Take 500 mg by mouth 2 (two) times daily with a meal.     OVER THE COUNTER MEDICATION Take 1 tablet by mouth daily as needed (sinus medicaiton).     oxymetazoline (AFRIN NASAL SPRAY) 0.05 % nasal spray Place 2 sprays into the nose 2 (two) times daily. Use for no more than 3 days (Patient not taking: Reported on  03/19/2022) 30 mL 0   TURMERIC PO Take by mouth.     Current Facility-Administered Medications  Medication Dose Route Frequency Provider Last Rate Last Admin   0.9 %  sodium chloride infusion  500 mL Intravenous Continuous Rachael Fee, MD        Allergies as of 04/22/2022 - Review Complete 04/22/2022  Allergen Reaction Noted   Banana Swelling 10/13/2011   Onion Swelling 09/23/2012   Penicillins Swelling 10/13/2011   Tomato Swelling 10/13/2011   Metformin and related Diarrhea 12/15/2020    Family History  Problem Relation Age of Onset   Hypertension Mother    Aneurysm Mother    Coronary aneurysm Mother    Hypotension Mother    Hypertension Father    Glaucoma Father    Hypotension Father    Colon cancer Neg Hx    Colon polyps Neg Hx    Crohn's disease Neg Hx    Esophageal cancer Neg Hx    Rectal cancer Neg Hx    Stomach cancer Neg Hx     Social History   Socioeconomic History   Marital status: Single    Spouse name: Not on file   Number of children: Not on file   Years of education: Not on file  Highest education level: Not on file  Occupational History   Not on file  Tobacco Use   Smoking status: Former    Types: Cigarettes    Quit date: 05/23/2011    Years since quitting: 10.9    Passive exposure: Never   Smokeless tobacco: Never  Vaping Use   Vaping Use: Never used  Substance and Sexual Activity   Alcohol use: Yes    Comment: less than once a quarter, if so, mixed drink   Drug use: No   Sexual activity: Not Currently  Other Topics Concern   Not on file  Social History Narrative   Not on file   Social Determinants of Health   Financial Resource Strain: Not on file  Food Insecurity: Not on file  Transportation Needs: Not on file  Physical Activity: Not on file  Stress: Not on file  Social Connections: Not on file  Intimate Partner Violence: Not on file     Physical Exam:  Constitutional: generally well-appearing Psychiatric: alert and  oriented x3 Lungs: CTA bilaterally Heart: no MCR  Assessment and plan: 51 y.o. female with routine risk for CRC  Screening colonoscopy today.  Care is appropriate for the ambulatory setting.  Owens Loffler, MD [facility] 04/22/2022, 2:17 PM

## 2022-04-24 ENCOUNTER — Telehealth: Payer: Self-pay | Admitting: *Deleted

## 2022-04-24 NOTE — Telephone Encounter (Signed)
  Follow up Call-     04/22/2022    2:18 PM  Call back number  Post procedure Call Back phone  # [phone]  Permission to leave phone message Yes     Patient questions:  Do you have a fever, pain , or abdominal swelling? No. Pain Score  0 *  Have you tolerated food without any problems? Yes.    Have you been able to return to your normal activities? Yes.    Do you have any questions about your discharge instructions: Diet   No. Medications  No. Follow up visit  No.  Do you have questions or concerns about your Care? No.  Actions: * If pain score is 4 or above: No action needed, pain <4.

## 2022-04-29 ENCOUNTER — Encounter: Payer: Self-pay | Admitting: Gastroenterology

## 2024-03-29 ENCOUNTER — Emergency Department (HOSPITAL_COMMUNITY)
Admission: EM | Admit: 2024-03-29 | Discharge: 2024-03-29 | Disposition: A | Discharge status: Home / Self Care | Attending: Emergency Medicine | Admitting: Emergency Medicine

## 2024-03-29 ENCOUNTER — Encounter (HOSPITAL_COMMUNITY): Payer: Self-pay

## 2024-03-29 ENCOUNTER — Other Ambulatory Visit: Payer: Self-pay

## 2024-03-29 DIAGNOSIS — N3001 Acute cystitis with hematuria: Secondary | ICD-10-CM | POA: Diagnosis not present

## 2024-03-29 DIAGNOSIS — R1030 Lower abdominal pain, unspecified: Secondary | ICD-10-CM | POA: Insufficient documentation

## 2024-03-29 DIAGNOSIS — R112 Nausea with vomiting, unspecified: Secondary | ICD-10-CM

## 2024-03-29 DIAGNOSIS — E119 Type 2 diabetes mellitus without complications: Secondary | ICD-10-CM | POA: Insufficient documentation

## 2024-03-29 DIAGNOSIS — Z794 Long term (current) use of insulin: Secondary | ICD-10-CM | POA: Diagnosis not present

## 2024-03-29 LAB — COMPREHENSIVE METABOLIC PANEL WITH GFR
ALT: 17 U/L (ref 0–44)
AST: 16 U/L (ref 15–41)
Albumin: 3.8 g/dL (ref 3.5–5.0)
Alkaline Phosphatase: 80 U/L (ref 38–126)
Anion gap: 13 (ref 5–15)
BUN: 11 mg/dL (ref 6–20)
CO2: 22 mmol/L (ref 22–32)
Calcium: 9.3 mg/dL (ref 8.9–10.3)
Chloride: 100 mmol/L (ref 98–111)
Creatinine, Ser: 0.64 mg/dL (ref 0.44–1.00)
GFR, Estimated: >60 mL/min (ref >60)
Glucose, Bld: 195 mg/dL — ABNORMAL HIGH (ref 70–99)
Potassium: 4 mmol/L (ref 3.5–5.1)
Sodium: 135 mmol/L (ref 135–145)
Total Bilirubin: 0.9 mg/dL (ref 0.0–1.2)
Total Protein: 8.6 g/dL — ABNORMAL HIGH (ref 6.5–8.1)

## 2024-03-29 LAB — CBC
HCT: 44 % (ref 36.0–46.0)
Hemoglobin: 14.2 g/dL (ref 12.0–15.0)
MCH: 28.2 pg (ref 26.0–34.0)
MCHC: 32.3 g/dL (ref 30.0–36.0)
MCV: 87.5 fL (ref 80.0–100.0)
Platelets: 356 10*3/uL (ref 150–400)
RBC: 5.03 MIL/uL (ref 3.87–5.11)
RDW: 12.3 % (ref 11.5–15.5)
WBC: 13 10*3/uL — ABNORMAL HIGH (ref 4.0–10.5)
nRBC: 0 % (ref 0.0–0.2)

## 2024-03-29 LAB — URINALYSIS, ROUTINE W REFLEX MICROSCOPIC
Bilirubin Urine: NEGATIVE
Glucose, UA: >=500 mg/dL — AB
Hgb urine dipstick: NEGATIVE
Ketones, ur: 20 mg/dL — AB
Nitrite: NEGATIVE
Protein, ur: 30 mg/dL — AB
Specific Gravity, Urine: 1.027 (ref 1.005–1.030)
WBC, UA: >50 WBC/hpf (ref 0–5)
pH: 6 (ref 5.0–8.0)

## 2024-03-29 LAB — LIPASE, BLOOD: Lipase: 38 U/L (ref 11–51)

## 2024-03-29 MED ORDER — SODIUM CHLORIDE 0.9 % IV BOLUS
500.0000 mL | Freq: Once | INTRAVENOUS | Status: AC
  Administered 2024-03-29: 500 mL via INTRAVENOUS

## 2024-03-29 MED ORDER — METOCLOPRAMIDE HCL 5 MG/ML IJ SOLN
5.0000 mg | Freq: Once | INTRAMUSCULAR | Status: AC
  Administered 2024-03-29: 5 mg via INTRAVENOUS
  Filled 2024-03-29: qty 2

## 2024-03-29 MED ORDER — SODIUM CHLORIDE 0.9 % IV SOLN
1.0000 g | Freq: Once | INTRAVENOUS | Status: AC
  Administered 2024-03-29: 1 g via INTRAVENOUS
  Filled 2024-03-29: qty 10

## 2024-03-29 NOTE — ED Provider Notes (Signed)
 [place] [facility] DEPARTMENT AT [facility] Provider Note   CSN: [id number] Arrival date & time: 03/29/24  1301     History  Chief Complaint  Patient presents with   Abdominal Pain   Nausea   Emesis    Adriana Preston is a 53 y.o. female who presents emergency department with a chief complaint of lower abdominal pain and pressure and vomiting.  She is a past medical history of diabetes.  Patient reports that this morning she became very nauseous while at work.  She vomited and had to leave work.  She went to an urgent care where she was seen and diagnosed with urinary tract infection.  I reviewed outpatient EMR which shows that the patient was given a prescription of Zofran  and Macrobid.  She did not pick these medications up but came to the emergency department because she continued to have nausea and vomited several times.  She denies fever chills or other symptoms.  She denies vaginal symptoms or discharge.  She denies vaginal or vulvar rash.   Abdominal Pain Associated symptoms: vomiting   Emesis Associated symptoms: abdominal pain        Home Medications Prior to Admission medications   Medication Sig Start Date End Date Taking? Authorizing Provider  Accu-Chek Softclix Lancets lancets 3 (three) times daily. 01/17/22   [provider]  acetaminophen  (TYLENOL  8 HOUR) 650 MG CR tablet Take 1 tablet (650 mg total) by mouth every 8 (eight) hours as needed for pain. 08/10/17   Deatra Face, MD  augmented betamethasone dipropionate (DIPROLENE-AF) 0.05 % ointment Apply topically. 01/23/22   [provider]  BD PEN NEEDLE NANO 2ND GEN 32G X 4 MM MISC daily. as directed 01/17/22   [provider]  Blood Glucose Monitoring Suppl (ACCU-CHEK GUIDE) w/Device KIT by Does not apply route. 01/17/22   [provider]  Blood Glucose Monitoring Suppl (ACCU-CHEK GUIDE) w/Device KIT See admin instructions. 01/17/22   [provider]   cetirizine -pseudoephedrine  (ZYRTEC -D) 5-120 MG per tablet Take 1 tablet by mouth 2 (two) times daily. Patient taking differently: Take 1 tablet by mouth daily. 04/15/13   Carleton Cheek, PA-C  CINNAMON PO Take by mouth.    [provider]  cyanocobalamin 1000 MCG tablet Take by mouth.    [provider]  doxycycline (VIBRA-TABS) 100 MG tablet Take 100 mg by mouth 2 (two) times daily. 02/15/22   [provider]  exenatide (BYETTA) 10 MCG/0.04ML SOLN Inject 10 mcg into the skin 2 (two) times daily with a meal.    [provider]  ibuprofen  (ADVIL ,MOTRIN ) 600 MG tablet Take 1 tablet (600 mg total) by mouth every 6 (six) hours as needed. 08/10/17   Deatra Face, MD  insulin  degludec (TRESIBA) 200 UNIT/ML FlexTouch Pen Inject into the skin. 01/17/22   [provider]  JARDIANCE 25 MG TABS tablet Take 25 mg by mouth daily. 03/17/22   [provider]  ketoconazole (NIZORAL) 2 % cream Apply topically 2 (two) times daily. 01/11/22   [provider]  OVER THE COUNTER MEDICATION Take 1 tablet by mouth daily as needed (sinus medicaiton).    [provider]  oxymetazoline  (AFRIN NASAL SPRAY) 0.05 % nasal spray Place 2 sprays into the nose 2 (two) times daily. Use for no more than 3 days Patient not taking: Reported on 03/19/2022 04/15/13   Carleton Cheek, PA-C  rosuvastatin (CRESTOR) 40 MG tablet Take 40 mg by mouth daily. 03/18/22   [provider]  TURMERIC PO Take by mouth.    [provider]      Allergies    Griseofulvin, Banana, Onion, Penicillins, Tomato, and Metformin and related    Review of Systems   Review of Systems  Gastrointestinal:  Positive for abdominal pain and vomiting.    Physical Exam Updated Vital Signs BP 138/74 (BP Location: Right Arm)   Pulse 83   Temp 98.4 F (36.9 C) (Oral)   Resp 18   SpO2 98%  Physical Exam Vitals and nursing note reviewed.  Constitutional:      General: She is not  in acute distress.    Appearance: She is well-developed. She is not diaphoretic.  HENT:     Head: Normocephalic and atraumatic.     Right Ear: External ear normal.     Left Ear: External ear normal.     Nose: Nose normal.     Mouth/Throat:     Mouth: Mucous membranes are moist.  Eyes:     General: No scleral icterus.    Conjunctiva/sclera: Conjunctivae normal.  Cardiovascular:     Rate and Rhythm: Normal rate and regular rhythm.     Heart sounds: Normal heart sounds. No murmur heard.    No friction rub. No gallop.  Pulmonary:     Effort: Pulmonary effort is normal. No respiratory distress.     Breath sounds: Normal breath sounds.  Abdominal:     General: Bowel sounds are normal. There is no distension.     Palpations: Abdomen is soft. There is no mass.     Tenderness: There is abdominal tenderness in the suprapubic area. There is no right CVA tenderness, left CVA tenderness or guarding.  Musculoskeletal:     Cervical back: Normal range of motion.  Skin:    General: Skin is warm and dry.  Neurological:     Mental Status: She is alert and oriented to person, place, and time.  Psychiatric:        Behavior: Behavior normal.     ED Results / Procedures / Treatments   Labs (all labs ordered are listed, but only abnormal results are displayed) Labs Reviewed  COMPREHENSIVE METABOLIC PANEL WITH GFR - Abnormal; Notable for the following components:      Result Value   Glucose, Bld 195 (*)    Total Protein 8.6 (*)    All other components within normal limits  CBC - Abnormal; Notable for the following components:   WBC 13.0 (*)    All other components within normal limits  URINALYSIS, ROUTINE W REFLEX MICROSCOPIC - Abnormal; Notable for the following components:   APPearance CLOUDY (*)    Glucose, UA >=500 (*)    Ketones, ur 20 (*)    Protein, ur 30 (*)    Leukocytes,Ua LARGE (*)    Bacteria, UA RARE (*)    All other components within normal limits  URINE CULTURE  LIPASE,  BLOOD    EKG None  Radiology No results found.  Procedures Procedures    Medications Ordered in ED Medications  sodium chloride  0.9 % bolus 500 mL (0 mLs Intravenous Stopped 03/29/24 1718)  cefTRIAXone  (ROCEPHIN ) 1 g in sodium chloride  0.9 % 100 mL IVPB (0 g Intravenous Stopped 03/29/24 1718)  metoCLOPramide  (REGLAN ) injection 5 mg (5 mg Intravenous Given 03/29/24 1552)    ED Course/ Medical Decision Making/ A&P  Medical Decision Making Amount and/or Complexity of Data Reviewed Labs: ordered.  Risk Prescription drug management.   This patient presents to the ED for concern of vomiting, this involves an extensive number of treatment options, and is a complaint that carries with it a high risk of complications and morbidity.  The emergent differential diagnosis for vomiting includes, but is not limited to ACS/MI, DKA, Ischemic bowel, Meningitis, Sepsis, Acute gastric dilation, Adrenal insufficiency, Appendicitis,  Bowel obstruction/ileus, Carbon monoxide poisoning, Cholecystitis, Electrolyte abnormalities, Elevated ICP, Gastric outlet obstruction, Pancreatitis, Ruptured viscus, Biliary colic, Cannabinoid hyperemesis syndrome, Gastritis, Gastroenteritis, Gastroparesis,  Narcotic withdrawal, Peptic ulcer disease, and UTI   Co morbidities:  has a past medical history of Allergy, Diabetes mellitus without complication (HCC), and Headache, classical migraine.   Social Determinants of Health:   SDOH Screenings   Food Insecurity: No Food Insecurity (01/16/2024)   Received from [facility]  Housing: Low Risk  (01/16/2024)   Received from [facility]  Transportation Needs: No Transportation Needs (01/16/2024)   Received from [facility]  Utilities: Not At Risk (01/16/2024)   Received from [facility]  Financial Resource Strain: Low Risk  (01/16/2024)   Received from [facility]  Physical Activity: Unknown (10/08/2023)   Received from [facility]  Social Connections: Patient Declined (10/08/2023)   Received from [facility]  Stress: Patient Declined (10/08/2023)   Received from [facility]  Tobacco Use: Medium Risk (03/29/2024)     Additional history:  Additional history obtained from Nursing triage External records from outside source obtained and reviewed including recent UC notes  Lab Tests:  I Ordered, and personally interpreted labs.  The pertinent results include:   Urine shows evidence of infection, sent for culture Lipase within normal limits CMP shows blood sugar 195, CBC with white blood cell count 13,000   Medicines ordered and prescription drug management:  I ordered medication including  Medications  sodium chloride  0.9 % bolus 500 mL (0 mLs Intravenous Stopped 03/29/24 1718)  cefTRIAXone  (ROCEPHIN ) 1 g in sodium chloride  0.9 % 100 mL IVPB (0 g Intravenous Stopped 03/29/24 1718)  metoCLOPramide  (REGLAN ) injection 5 mg (5 mg Intravenous Given 03/29/24 1552)   for urinary tract infection and vomiting Reevaluation of the patient after these medicines showed that the patient resolved I have reviewed the patients home medicines and have made adjustments as needed  Test Considered:   considered CT abdomen pelvis however patient's exam is benign and I suspect that all of this is due to urinary tract infection.    Problem List / ED Course:     ICD-10-CM   1. Nausea and vomiting, unspecified vomiting type  R11.2     2. Acute cystitis with hematuria  N30.01       MDM: Patient here with vomiting and lower abdominal pressure now resolved after fluids and antiemetics.  Suspect this is all secondary to urinary tract infection.  Patient's blood sugar at 195.  Remainder of her workers this insignificant with a benign abdominal exam.  Will discharge patient to pick up her previously prescribed Macrobid and Zofran .  Discussed outpatient follow-up and return precautions, urine sent for  culture.           Final Clinical Impression(s) / ED Diagnoses Final diagnoses:  Nausea and vomiting, unspecified vomiting type  Acute cystitis with hematuria    Rx / DC Orders ED Discharge Orders     None         Tama Fails, PA-C 03/29/24 1732  Scarlette Currier, MD 04/02/24 2308

## 2024-03-29 NOTE — Discharge Instructions (Addendum)
 Please pick up and take the medications previously prescribed by the Urgent Care today. Contact a health care provider if: Your symptoms don't get better after 1-2 days of taking antibiotics. Your symptoms go away and then come back. You have a fever or chills. You vomit or feel like you may vomit. Get help right away if: You have very bad pain in your back or lower belly. You faint.

## 2024-03-29 NOTE — ED Triage Notes (Signed)
 PT arrives via POV. Pt reports since this past Wednesday, she has been experiencing abdominal discomfort, nausea, vomiting, constipation and pressure in her vagina when urinating. Pt did have small BM this morning. PT is AxOx4.

## 2024-03-29 NOTE — ED Notes (Signed)
 Called Lab to add Urine Culture.

## 2024-03-31 LAB — URINE CULTURE: Culture: >=100000 — AB

## 2024-04-01 ENCOUNTER — Telehealth (HOSPITAL_BASED_OUTPATIENT_CLINIC_OR_DEPARTMENT_OTHER): Payer: Self-pay | Admitting: *Deleted

## 2024-04-01 NOTE — Telephone Encounter (Signed)
 Post ED Visit - Positive Culture Follow-up  Culture report reviewed by antimicrobial stewardship pharmacist: [facility] []  Court Distance, Pharm.D. []  Skeet Duke, [address].D., BCPS AQ-ID []  Leslee Rase, Pharm.D., BCPS []  Garland Junk, Pharm.D., BCPS []  [place], [place].D., BCPS, AAHIVP []  Alcide Aly, Pharm.D., BCPS, AAHIVP []  Jerri Morale, PharmD, BCPS []  Graham Laws, PharmD, BCPS []  Cleda Curly, PharmD, BCPS []  Tamar Fairly, PharmD []  Ballard Levels, PharmD, BCPS []  Ollen Beverage, PharmD  [facility] [x]  Denson Flake, PharmD []  Sherryle Don, PharmD []  Van Gelinas, PharmD []  Delila Felty, Rph []  [place]una [place]) Cleora Daft, PharmD []  Augustina Block, PharmD []  Arie Kurtz, PharmD []  Sharlyn Deaner, PharmD []  Agnes Hose, PharmD []  Kendall Pauls, PharmD []  Gladstone Lamer, PharmD []  Armanda Bern, PharmD []  Tera Fellows, PharmD   Positive urine culture 03/29/24. Has nitrofurantoin prescribed 03/29/24 from urgent care; bacteria sensitive to the same. No further patient follow-up is required at this time.  Zeb Heys 04/01/2024, 8:32 AM

## 2024-11-22 ENCOUNTER — Other Ambulatory Visit (HOSPITAL_BASED_OUTPATIENT_CLINIC_OR_DEPARTMENT_OTHER): Payer: Self-pay

## 2024-11-22 MED ORDER — MOUNJARO 7.5 MG/0.5ML ~~LOC~~ SOAJ
7.5000 mg | SUBCUTANEOUS | 3 refills | Status: AC
  Filled 2024-11-22: qty 2, 28d supply, fill #0
